# Patient Record
Sex: Female | Born: 1940 | Race: White | Hispanic: No | Marital: Married | Smoking: Never smoker
Health system: Southern US, Community
[De-identification: ages and names within clinical notes are randomized; demographics above are authoritative.]

## PROBLEM LIST (undated history)

## (undated) DIAGNOSIS — I4891 Unspecified atrial fibrillation: Secondary | ICD-10-CM

## (undated) DIAGNOSIS — I639 Cerebral infarction, unspecified: Secondary | ICD-10-CM

## (undated) DIAGNOSIS — I10 Essential (primary) hypertension: Secondary | ICD-10-CM

## (undated) DIAGNOSIS — E7800 Pure hypercholesterolemia, unspecified: Secondary | ICD-10-CM

---

## 1998-08-26 ENCOUNTER — Ambulatory Visit (HOSPITAL_COMMUNITY): Admission: RE | Admit: 1998-08-26 | Discharge: 1998-08-26 | Payer: Self-pay | Admitting: Obstetrics & Gynecology

## 2008-10-04 ENCOUNTER — Emergency Department (HOSPITAL_BASED_OUTPATIENT_CLINIC_OR_DEPARTMENT_OTHER): Admission: EM | Admit: 2008-10-04 | Discharge: 2008-10-05 | Payer: Self-pay | Admitting: Emergency Medicine

## 2008-10-04 ENCOUNTER — Ambulatory Visit: Payer: Self-pay | Admitting: Diagnostic Radiology

## 2011-01-03 LAB — DIFFERENTIAL
Eosinophils Absolute: 0.1 10*3/uL (ref 0.0–0.7)
Eosinophils Relative: 1 % (ref 0–5)
Lymphs Abs: 0.9 10*3/uL (ref 0.7–4.0)
Monocytes Relative: 5 % (ref 3–12)

## 2011-01-03 LAB — URINALYSIS, ROUTINE W REFLEX MICROSCOPIC
Ketones, ur: 15 mg/dL — AB
Nitrite: NEGATIVE
pH: 5.5 (ref 5.0–8.0)

## 2011-01-03 LAB — COMPREHENSIVE METABOLIC PANEL
Albumin: 4.4 g/dL (ref 3.5–5.2)
Alkaline Phosphatase: 77 U/L (ref 39–117)
BUN: 21 mg/dL (ref 6–23)
CO2: 24 mEq/L (ref 19–32)
Chloride: 98 mEq/L (ref 96–112)
Creatinine, Ser: 1.4 mg/dL — ABNORMAL HIGH (ref 0.4–1.2)
GFR calc non Af Amer: 38 mL/min — ABNORMAL LOW (ref >60)
Glucose, Bld: 120 mg/dL — ABNORMAL HIGH (ref 70–99)
Potassium: 2.8 mEq/L — ABNORMAL LOW (ref 3.5–5.1)
Total Bilirubin: 0.6 mg/dL (ref 0.3–1.2)

## 2011-01-03 LAB — URINE CULTURE: Colony Count: >=100000

## 2011-01-03 LAB — URINE MICROSCOPIC-ADD ON

## 2011-01-03 LAB — CBC
HCT: 40.5 % (ref 36.0–46.0)
Hemoglobin: 13.9 g/dL (ref 12.0–15.0)
MCV: 83 fL (ref 78.0–100.0)
Platelets: 296 10*3/uL (ref 150–400)
RBC: 4.88 MIL/uL (ref 3.87–5.11)
WBC: 14 10*3/uL — ABNORMAL HIGH (ref 4.0–10.5)

## 2011-01-03 LAB — PROTIME-INR: Prothrombin Time: 12.7 seconds (ref 11.6–15.2)

## 2015-08-26 DIAGNOSIS — I340 Nonrheumatic mitral (valve) insufficiency: Secondary | ICD-10-CM | POA: Insufficient documentation

## 2015-08-26 DIAGNOSIS — E785 Hyperlipidemia, unspecified: Secondary | ICD-10-CM | POA: Diagnosis present

## 2015-08-26 DIAGNOSIS — I10 Essential (primary) hypertension: Secondary | ICD-10-CM | POA: Diagnosis present

## 2016-04-05 DIAGNOSIS — F411 Generalized anxiety disorder: Secondary | ICD-10-CM | POA: Insufficient documentation

## 2016-04-05 DIAGNOSIS — G629 Polyneuropathy, unspecified: Secondary | ICD-10-CM | POA: Insufficient documentation

## 2016-04-05 DIAGNOSIS — K219 Gastro-esophageal reflux disease without esophagitis: Secondary | ICD-10-CM | POA: Insufficient documentation

## 2016-11-22 ENCOUNTER — Emergency Department (HOSPITAL_COMMUNITY): Payer: Medicare Other

## 2016-11-22 ENCOUNTER — Encounter (HOSPITAL_COMMUNITY): Payer: Self-pay

## 2016-11-22 ENCOUNTER — Inpatient Hospital Stay (HOSPITAL_COMMUNITY)
Admission: EM | Admit: 2016-11-22 | Discharge: 2016-11-25 | DRG: 064 | Disposition: A | Discharge status: Home / Self Care | Payer: Medicare Other | Attending: Internal Medicine | Admitting: Internal Medicine

## 2016-11-22 DIAGNOSIS — Z79899 Other long term (current) drug therapy: Secondary | ICD-10-CM

## 2016-11-22 DIAGNOSIS — I63411 Cerebral infarction due to embolism of right middle cerebral artery: Secondary | ICD-10-CM | POA: Diagnosis present

## 2016-11-22 DIAGNOSIS — Z66 Do not resuscitate: Secondary | ICD-10-CM | POA: Diagnosis present

## 2016-11-22 DIAGNOSIS — R471 Dysarthria and anarthria: Secondary | ICD-10-CM | POA: Diagnosis present

## 2016-11-22 DIAGNOSIS — H518 Other specified disorders of binocular movement: Secondary | ICD-10-CM | POA: Diagnosis present

## 2016-11-22 DIAGNOSIS — R29810 Facial weakness: Secondary | ICD-10-CM | POA: Diagnosis present

## 2016-11-22 DIAGNOSIS — E785 Hyperlipidemia, unspecified: Secondary | ICD-10-CM | POA: Diagnosis present

## 2016-11-22 DIAGNOSIS — Z91040 Latex allergy status: Secondary | ICD-10-CM | POA: Diagnosis not present

## 2016-11-22 DIAGNOSIS — F411 Generalized anxiety disorder: Secondary | ICD-10-CM | POA: Diagnosis not present

## 2016-11-22 DIAGNOSIS — I480 Paroxysmal atrial fibrillation: Secondary | ICD-10-CM | POA: Diagnosis not present

## 2016-11-22 DIAGNOSIS — Z881 Allergy status to other antibiotic agents status: Secondary | ICD-10-CM | POA: Diagnosis not present

## 2016-11-22 DIAGNOSIS — Z515 Encounter for palliative care: Secondary | ICD-10-CM | POA: Diagnosis present

## 2016-11-22 DIAGNOSIS — R200 Anesthesia of skin: Secondary | ICD-10-CM | POA: Diagnosis not present

## 2016-11-22 DIAGNOSIS — Z888 Allergy status to other drugs, medicaments and biological substances status: Secondary | ICD-10-CM

## 2016-11-22 DIAGNOSIS — G839 Paralytic syndrome, unspecified: Secondary | ICD-10-CM

## 2016-11-22 DIAGNOSIS — K219 Gastro-esophageal reflux disease without esophagitis: Secondary | ICD-10-CM | POA: Diagnosis present

## 2016-11-22 DIAGNOSIS — I4891 Unspecified atrial fibrillation: Secondary | ICD-10-CM | POA: Diagnosis present

## 2016-11-22 DIAGNOSIS — G629 Polyneuropathy, unspecified: Secondary | ICD-10-CM | POA: Diagnosis present

## 2016-11-22 DIAGNOSIS — G4700 Insomnia, unspecified: Secondary | ICD-10-CM | POA: Diagnosis present

## 2016-11-22 DIAGNOSIS — Z8711 Personal history of peptic ulcer disease: Secondary | ICD-10-CM

## 2016-11-22 DIAGNOSIS — G8194 Hemiplegia, unspecified affecting left nondominant side: Secondary | ICD-10-CM | POA: Diagnosis present

## 2016-11-22 DIAGNOSIS — I10 Essential (primary) hypertension: Secondary | ICD-10-CM | POA: Diagnosis present

## 2016-11-22 DIAGNOSIS — H53462 Homonymous bilateral field defects, left side: Secondary | ICD-10-CM

## 2016-11-22 DIAGNOSIS — I639 Cerebral infarction, unspecified: Secondary | ICD-10-CM

## 2016-11-22 DIAGNOSIS — M1611 Unilateral primary osteoarthritis, right hip: Secondary | ICD-10-CM | POA: Diagnosis present

## 2016-11-22 DIAGNOSIS — G936 Cerebral edema: Secondary | ICD-10-CM | POA: Diagnosis present

## 2016-11-22 DIAGNOSIS — Z91030 Bee allergy status: Secondary | ICD-10-CM | POA: Diagnosis not present

## 2016-11-22 DIAGNOSIS — I63511 Cerebral infarction due to unspecified occlusion or stenosis of right middle cerebral artery: Secondary | ICD-10-CM

## 2016-11-22 HISTORY — DX: Unspecified atrial fibrillation: I48.91

## 2016-11-22 HISTORY — DX: Pure hypercholesterolemia, unspecified: E78.00

## 2016-11-22 HISTORY — DX: Essential (primary) hypertension: I10

## 2016-11-22 LAB — CBC WITH DIFFERENTIAL/PLATELET
BASOS PCT: 1 %
Basophils Absolute: 0.1 10*3/uL (ref 0.0–0.1)
EOS ABS: 0.3 10*3/uL (ref 0.0–0.7)
Eosinophils Relative: 3 %
HCT: 39.6 % (ref 36.0–46.0)
HEMOGLOBIN: 13.7 g/dL (ref 12.0–15.0)
Lymphocytes Relative: 15 %
Lymphs Abs: 1.3 10*3/uL (ref 0.7–4.0)
MCH: 28.9 pg (ref 26.0–34.0)
MCHC: 34.6 g/dL (ref 30.0–36.0)
MCV: 83.5 fL (ref 78.0–100.0)
MONO ABS: 1 10*3/uL (ref 0.1–1.0)
MONOS PCT: 11 %
NEUTROS PCT: 70 %
Neutro Abs: 6.2 10*3/uL (ref 1.7–7.7)
PLATELETS: 166 10*3/uL (ref 150–400)
RBC: 4.74 MIL/uL (ref 3.87–5.11)
RDW: 13.3 % (ref 11.5–15.5)
WBC: 8.9 10*3/uL (ref 4.0–10.5)

## 2016-11-22 LAB — URINALYSIS, ROUTINE W REFLEX MICROSCOPIC
BILIRUBIN URINE: NEGATIVE
Glucose, UA: NEGATIVE mg/dL
HGB URINE DIPSTICK: NEGATIVE
Ketones, ur: NEGATIVE mg/dL
NITRITE: NEGATIVE
PH: 7 (ref 5.0–8.0)
Protein, ur: NEGATIVE mg/dL
SPECIFIC GRAVITY, URINE: 1.034 — AB (ref 1.005–1.030)

## 2016-11-22 LAB — RAPID URINE DRUG SCREEN, HOSP PERFORMED
Amphetamines: NOT DETECTED
BARBITURATES: NOT DETECTED
Benzodiazepines: NOT DETECTED
Cocaine: NOT DETECTED
Opiates: NOT DETECTED
TETRAHYDROCANNABINOL: NOT DETECTED

## 2016-11-22 LAB — PROTIME-INR
INR: 1.11
Prothrombin Time: 14.3 seconds (ref 11.4–15.2)

## 2016-11-22 LAB — COMPREHENSIVE METABOLIC PANEL
ALK PHOS: 41 U/L (ref 38–126)
ALT: 19 U/L (ref 14–54)
AST: 20 U/L (ref 15–41)
Albumin: 3.6 g/dL (ref 3.5–5.0)
Anion gap: 7 (ref 5–15)
BUN: 17 mg/dL (ref 6–20)
CALCIUM: 8.9 mg/dL (ref 8.9–10.3)
CO2: 27 mmol/L (ref 22–32)
CREATININE: 0.85 mg/dL (ref 0.44–1.00)
Chloride: 103 mmol/L (ref 101–111)
GFR calc non Af Amer: >60 mL/min (ref >60)
Glucose, Bld: 97 mg/dL (ref 65–99)
Potassium: 3.4 mmol/L — ABNORMAL LOW (ref 3.5–5.1)
SODIUM: 137 mmol/L (ref 135–145)
Total Bilirubin: 0.9 mg/dL (ref 0.3–1.2)
Total Protein: 6 g/dL — ABNORMAL LOW (ref 6.5–8.1)

## 2016-11-22 LAB — ETHANOL

## 2016-11-22 LAB — APTT: aPTT: 31 seconds (ref 24–36)

## 2016-11-22 LAB — TROPONIN I

## 2016-11-22 LAB — CBG MONITORING, ED: Glucose-Capillary: 90 mg/dL (ref 65–99)

## 2016-11-22 MED ORDER — IOPAMIDOL (ISOVUE-370) INJECTION 76%
INTRAVENOUS | Status: AC
  Administered 2016-11-22: 90 mL
  Filled 2016-11-22: qty 100

## 2016-11-22 MED ORDER — PANTOPRAZOLE SODIUM 40 MG PO TBEC
40.0000 mg | DELAYED_RELEASE_TABLET | Freq: Every day | ORAL | Status: DC
  Filled 2016-11-22: qty 1

## 2016-11-22 MED ORDER — OXYCODONE HCL 5 MG PO TABS: 5.0000 mg | ORAL_TABLET | ORAL | Status: DC | PRN

## 2016-11-22 MED ORDER — LORAZEPAM 0.5 MG PO TABS: 0.5000 mg | ORAL_TABLET | Freq: Three times a day (TID) | ORAL | Status: DC | PRN

## 2016-11-22 MED ORDER — METOPROLOL SUCCINATE ER 25 MG PO TB24: 50.0000 mg | ORAL_TABLET | Freq: Every day | ORAL | Status: DC

## 2016-11-22 MED ORDER — ZOLPIDEM TARTRATE 5 MG PO TABS
5.0000 mg | ORAL_TABLET | Freq: Every evening | ORAL | Status: DC | PRN
  Administered 2016-11-24: 5 mg via ORAL
  Filled 2016-11-22: qty 1

## 2016-11-22 MED ORDER — SODIUM CHLORIDE 0.9 % IV SOLN
100.0000 mL/h | INTRAVENOUS | Status: DC
  Administered 2016-11-22: 100 mL/h via INTRAVENOUS

## 2016-11-22 MED ORDER — ATORVASTATIN CALCIUM 80 MG PO TABS
80.0000 mg | ORAL_TABLET | Freq: Every day | ORAL | Status: DC
  Administered 2016-11-22: 80 mg via ORAL
  Filled 2016-11-22: qty 1

## 2016-11-22 MED ORDER — SENNOSIDES-DOCUSATE SODIUM 8.6-50 MG PO TABS: 1.0000 | ORAL_TABLET | Freq: Every evening | ORAL | Status: DC | PRN

## 2016-11-22 MED ORDER — GABAPENTIN 300 MG PO CAPS
300.0000 mg | ORAL_CAPSULE | Freq: Three times a day (TID) | ORAL | Status: DC
  Administered 2016-11-22: 300 mg via ORAL
  Filled 2016-11-22 (×2): qty 1

## 2016-11-22 MED ORDER — TRAMADOL HCL 50 MG PO TABS
50.0000 mg | ORAL_TABLET | Freq: Four times a day (QID) | ORAL | Status: DC | PRN
Start: 2016-11-22 — End: 2016-11-23

## 2016-11-22 MED ORDER — ONDANSETRON HCL 4 MG PO TABS: 4.0000 mg | ORAL_TABLET | Freq: Four times a day (QID) | ORAL | Status: DC | PRN

## 2016-11-22 MED ORDER — ASPIRIN 325 MG PO TABS
325.0000 mg | ORAL_TABLET | Freq: Every day | ORAL | Status: DC
  Administered 2016-11-22: 325 mg via ORAL
  Filled 2016-11-22 (×2): qty 1

## 2016-11-22 MED ORDER — ACETAMINOPHEN 325 MG PO TABS: 650.0000 mg | ORAL_TABLET | Freq: Four times a day (QID) | ORAL | Status: DC | PRN

## 2016-11-22 MED ORDER — DEXTROSE-NACL 5-0.45 % IV SOLN
INTRAVENOUS | Status: DC
  Administered 2016-11-22: 19:00:00 via INTRAVENOUS

## 2016-11-22 MED ORDER — MONTELUKAST SODIUM 10 MG PO TABS
10.0000 mg | ORAL_TABLET | Freq: Every day | ORAL | Status: DC
  Administered 2016-11-22: 10 mg via ORAL
  Filled 2016-11-22: qty 1

## 2016-11-22 MED ORDER — ONDANSETRON HCL 4 MG/2ML IJ SOLN: 4.0000 mg | Freq: Four times a day (QID) | INTRAMUSCULAR | Status: DC | PRN

## 2016-11-22 MED ORDER — SODIUM CHLORIDE 0.9 % IV BOLUS (SEPSIS)
500.0000 mL | Freq: Once | INTRAVENOUS | Status: AC
  Administered 2016-11-22: 500 mL via INTRAVENOUS

## 2016-11-22 MED ORDER — PHENOL 1.4 % MT LIQD: 1.0000 | OROMUCOSAL | Status: DC | PRN

## 2016-11-22 MED ORDER — IOPAMIDOL (ISOVUE-370) INJECTION 76%
INTRAVENOUS | Status: AC
  Filled 2016-11-22: qty 50

## 2016-11-22 NOTE — ED Triage Notes (Signed)
Pt arrives with L sided facial droop and complete L sided hemiparesis

## 2016-11-22 NOTE — ED Notes (Signed)
Right and left radial pulses bilat equal. Right dorsal pedia pulse strong. Left dorsalis pedia pulse faint. Right and left tibial pulses. Moderate and equal.

## 2016-11-22 NOTE — Consult Note (Signed)
Requesting Physician:  ED MD    Chief Complaint:   History obtained from:  Patient   Chart   HPI:                                                                                                                                         Katherine Mueller is an 76 y.o. female who unfortunately was normal until approximately 8 PM last night. This morning she was found by her husband to be flaccid on her left upper and lower extremity with the inability to move she also had a left facial droop. EMS was called, one of which was her son, [facility] but not a code stroke that she was out of the window. Patient was immediately brought to the CT scan. Patient had a CTA and CT perfusion. Unfortunately she did have a notable acute occlusion with clot from the proximal right common carotid artery to the right M2 branch. Ischemia and the entire right MCA territory with 80 mL infarct core. Unfortunately the core was too large to try to revive with intra-arterial surgery. There is also noted a 6 mm left supraclinoid ICA aneurysm which will need to be followed possibly. This was discussed with family and family was in agreement not to go further.  Date last known well: Date: 11/21/2016 Time last known well: Time: 20:00 tPA Given: No: out of window   Past Medical History:  Diagnosis Date  . A-fib (HCC)   . High cholesterol   . Hypertension     History reviewed. No pertinent surgical history.  No family history on file. Social History:  does not have a smoking history on file. She has never used smokeless tobacco. She reports that she does not drink alcohol. Her drug history is not on file.  Allergies:  Allergies  Allergen Reactions  . Bee Venom Swelling  . Meperidine Swelling    Swelling of lips  . Azithromycin Other (See Comments)    unspecified  . Celecoxib Other (See Comments)    unspecified  . Latex Other (See Comments)    unspecified  . Betamethasone Swelling     Facial swelling after steroid injection  . Prednisone Rash    Medications:  Current Facility-Administered Medications  Medication Dose Route Frequency Provider Last Rate Last Dose  . 0.9 %  sodium chloride infusion  100 mL/hr Intravenous Continuous Gerhard Munch, MD 100 mL/hr at 11/22/16 1234 100 mL/hr at 11/22/16 1234   Current Outpatient Prescriptions  Medication Sig Dispense Refill  . acetaminophen (TYLENOL) 325 MG tablet Take 650 mg by mouth every 6 (six) hours as needed for mild pain.    . B Complex-C (B-COMPLEX WITH VITAMIN C) tablet Take 1 tablet by mouth daily.    . diphenhydrAMINE (BENADRYL) 25 MG tablet Take 25 mg by mouth every 6 (six) hours as needed for sleep.    Marland Kitchen gabapentin (NEURONTIN) 300 MG capsule Take 300 mg by mouth 3 (three) times daily.    . hydrochlorothiazide (HYDRODIURIL) 25 MG tablet Take 25 mg by mouth daily.    Marland Kitchen LORazepam (ATIVAN) 0.5 MG tablet Take 0.5 mg by mouth every 8 (eight) hours as needed for anxiety.    . metoprolol succinate (TOPROL-XL) 50 MG 24 hr tablet Take 50 mg by mouth daily. Take with or immediately following a meal.    . montelukast (SINGULAIR) 10 MG tablet Take 10 mg by mouth at bedtime.    . Multiple Vitamin (MULTIVITAMIN) tablet Take 1 tablet by mouth daily.    Marland Kitchen omeprazole (PRILOSEC) 20 MG capsule Take 20 mg by mouth 2 (two) times daily before a meal.    . pravastatin (PRAVACHOL) 20 MG tablet Take 20 mg by mouth daily.    Marland Kitchen zolpidem (AMBIEN) 5 MG tablet Take 5 mg by mouth at bedtime as needed for sleep.       ROS:                                                                                                                                       History obtained from unobtainable from patient due to mental status   Neurologic Examination:                                                                                                       Blood pressure (!) 166/105, pulse 69, resp. rate 20, SpO2 92 %.  HEENT-  Normocephalic, no lesions, without obvious abnormality.  Normal external eye and conjunctiva.  Normal TM's bilaterally.  Normal auditory canals and external ears. Normal external nose, mucus membranes and septum.  Normal pharynx. Cardiovascular- irregularly irregular rhythm, pulses palpable throughout   Lungs- chest clear, no wheezing, rales, normal symmetric air entry Abdomen-  normal findings: bowel sounds normal Extremities- no edema Lymph-no adenopathy palpable Musculoskeletal-no joint tenderness, deformity or swelling Skin-warm and dry, no hyperpigmentation, vitiligo, or suspicious lesions  Neurological Examination Mental Status: Alert, oriented, thought content appropriate.  Speechdysarthric without evidence of aphasia.  Able to follow simple commands without difficulty. Cranial Nerves: II:  Visual fields grossly normal,  III,IV, VI: ptosis not present, right gaze preference and able to slightly cross midline to the left, pupils equal, round, reactive to light and accommodation V,VII: smile asymmetric on the left, facial light touch sensation decreased on the left IX,X: uvula rises symmetrically XI: bilateral shoulder shrug XII: ED 8 slightly to the left Motor: Flaccid left upper and lower extremity, 5/5 strength on the right Sensory: No sensation noted on the left upper and lower extremity  Deep Tendon Reflexes: 2+ and symmetric throughout Plantars: Right: downgoing   Left: Upgoing Cerebellar: normal finger-to-nose on the right,  and normal heel-to-shin test on the right Gait: Not tested       Lab Results: Basic Metabolic Panel:  Recent Labs Lab 11/22/16 1113  NA 137  K 3.4*  CL 103  CO2 27  GLUCOSE 97  BUN 17  CREATININE 0.85  CALCIUM 8.9    Liver Function Tests:  Recent Labs Lab 11/22/16 1113  AST 20  ALT 19  ALKPHOS 41  BILITOT 0.9  PROT 6.0*  ALBUMIN  3.6   No results for input(s): LIPASE, AMYLASE in the last 168 hours. No results for input(s): AMMONIA in the last 168 hours.  CBC:  Recent Labs Lab 11/22/16 1113  WBC 8.9  NEUTROABS 6.2  HGB 13.7  HCT 39.6  MCV 83.5  PLT 166    Cardiac Enzymes:  Recent Labs Lab 11/22/16 1113  TROPONINI <0.03    Lipid Panel: No results for input(s): CHOL, TRIG, HDL, CHOLHDL, VLDL, LDLCALC in the last 168 hours.  CBG:  Recent Labs Lab 11/22/16 1038  GLUCAP 90    Microbiology: Results for orders placed or performed during the hospital encounter of 10/04/08  Urine culture     Status: None   Collection Time: 10/04/08  5:09 PM  Result Value Ref Range Status   Specimen Description URINE, CLEAN CATCH  Final   Special Requests NONE  Final   Colony Count >=100,000 COLONIES/ML  Final   Culture ESCHERICHIA COLI  Final   Report Status 10/06/2008 FINAL  Final   Organism ID, Bacteria ESCHERICHIA COLI  Final      Susceptibility   Escherichia coli - MIC    AMPICILLIN <=2 Sensitive     CEFAZOLIN <=4 Sensitive     CEFTRIAXONE <=1 Sensitive     CIPROFLOXACIN <=0.25 Sensitive     GENTAMICIN <=1 Sensitive     LEVOFLOXACIN <=0.12 Sensitive     NITROFURANTOIN <=16 Sensitive     TOBRAMYCIN <=1 Sensitive     TRIMETH/SULFA <=20 Sensitive     Coagulation Studies:  Recent Labs  11/22/16 1113  LABPROT 14.3  INR 1.11    Imaging: Ct Angio Head W Or Wo Contrast  Result Date: 11/22/2016 CLINICAL DATA:  Left hemiplegia EXAM: CT ANGIOGRAPHY HEAD AND NECK CT PERFUSION BRAIN TECHNIQUE: Multidetector CT imaging of the head and neck was performed using the standard protocol during bolus administration of intravenous contrast. Multiplanar CT image reconstructions and MIPs were obtained to evaluate the vascular anatomy. Carotid stenosis measurements (when applicable) are obtained utilizing NASCET criteria, using the distal internal carotid diameter as the denominator. Multiphase CT imaging of the  brain  was performed following IV bolus contrast injection. Subsequent parametric perfusion maps were calculated using RAPID software. CONTRAST:  90 cc Isovue 370 intravenous COMPARISON:  Head CT from earlier today FINDINGS: CTA NECK FINDINGS Aortic arch: Atherosclerosis. Three vessel branching. 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 The clot is contiguous to the level of the right M2 segment. Left carotid system: Mild for age scattered calcified atherosclerotic plaque. No flow limiting stenosis or ulceration. 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 No flow limiting stenosis. No evidence of dissection. Skeleton: There is a lucency in the right occipital bone contiguous with jugular foramen which has smooth sclerotic margins, compatible with a benign process. This is likely an enlarged emissary vein based on density. No second bone lesion. Other neck: Negative Upper chest: Negative Review of the MIP images confirms the above findings CTA HEAD FINDINGS Anterior circulation: Right common carotid clot continues continuously into the right M1 and inferior division M2 branches. Some superior division branches are opacified. An anterior communicating artery is present, clot narrows the right A1 origin. 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 There is a 4 mm neck. Posterior circulation: Symmetric vertebral arteries. Left posterior communicating artery. No major branch occlusion or flow limiting stenosis noted. Venous sinuses: As permitted by contrast timing, patent. Anatomic variants: None significant Delayed phase: Not performed in the emergent setting Review of the MIP images confirms the above findings CT Brain Perfusion Findings: CBF (<30%) Volume:  80 mL Perfusion (Tmax>6.0s) volume: Mismatch Volume: Infarction Location:Densest infarct  involves the right insula, operculum, internal capsule/putamen and antro lateral frontal cortex. These results were called by telephone at the time of interpretation on 11/22/2016 at 11:07 am to Dr. Roxy Manns , who verbally acknowledged these results. IMPRESSION: 1. 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 Electronically Signed   By: Marnee Spring M.D.   On: 11/22/2016 11:27   Ct Head Wo Contrast  Result Date: 11/22/2016 CLINICAL DATA:  Left facial droop and left sided hemiparesis. EXAM: CT HEAD WITHOUT CONTRAST TECHNIQUE: Contiguous axial images were obtained from the base of the skull through the vertex without intravenous contrast. COMPARISON:  None. FINDINGS: Brain: Evidence of cytotoxic edema in the right MCA territory consistent with a large infarction. There is a dense right MCA sign. In no hemorrhage. No extra-axial fluid collections. Ventricles are in the midline without significant mass effect or shift. The right lateral ventricle may be slightly flattened. Underlying atrophy and periventricular white matter disease. Vascular: Dense right MCA sign.  Vascular calcifications noted. Skull: No skull fracture or bone lesions. Sinuses/Orbits: The paranasal sinuses and mastoid air cells are clear. The globes are intact. Other: No scalp lesion or hematoma. 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 No intracranial hemorrhage. Underlying cerebral atrophy and periventricular white matter disease. These results were called by telephone at the time of interpretation on 11/22/2016 at 11:03 am to Dr. Gerhard Munch , who verbally acknowledged these results. Electronically Signed   By: Rudie Meyer M.D.   On: 11/22/2016 11:03   Ct Angio Neck W Or Wo Contrast  Result Date: 11/22/2016 CLINICAL DATA:   Left hemiplegia EXAM: CT ANGIOGRAPHY HEAD AND  NECK CT PERFUSION BRAIN TECHNIQUE: Multidetector CT imaging of the head and neck was performed using the standard protocol during bolus administration of intravenous contrast. Multiplanar CT image reconstructions and MIPs were obtained to evaluate the vascular anatomy. Carotid stenosis measurements (when applicable) are obtained utilizing NASCET criteria, using the distal internal carotid diameter as the denominator. Multiphase CT imaging of the brain was performed following IV bolus contrast injection. Subsequent parametric perfusion maps were calculated using RAPID software. CONTRAST:  90 cc Isovue 370 intravenous COMPARISON:  Head CT from earlier today FINDINGS: CTA NECK FINDINGS Aortic arch: Atherosclerosis. Three vessel branching. 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 The clot is contiguous to the level of the right M2 segment. Left carotid system: Mild for age scattered calcified atherosclerotic plaque. No flow limiting stenosis or ulceration. 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 No flow limiting stenosis. No evidence of dissection. Skeleton: There is a lucency in the right occipital bone contiguous with jugular foramen which has smooth sclerotic margins, compatible with a benign process. This is likely an enlarged emissary vein based on density. No second bone lesion. Other neck: Negative Upper chest: Negative Review of the MIP images confirms the above findings CTA HEAD FINDINGS Anterior circulation: Right common carotid clot continues continuously into the right M1 and inferior division M2 branches. Some superior division branches are opacified. An anterior communicating artery is present, clot narrows the right A1 origin. 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 There is a 4 mm neck.  Posterior circulation: Symmetric vertebral arteries. Left posterior communicating artery. No major branch occlusion or flow limiting stenosis noted. Venous sinuses: As permitted by contrast timing, patent. Anatomic variants: None significant Delayed phase: Not performed in the emergent setting Review of the MIP images confirms the above findings CT Brain Perfusion Findings: CBF (<30%) Volume:  80 mL Perfusion (Tmax>6.0s) volume: Mismatch Volume: Infarction Location:Densest infarct involves the right insula, operculum, internal capsule/putamen and antro lateral frontal cortex. These results were called by telephone at the time of interpretation on 11/22/2016 at 11:07 am to Dr. Roxy Manns , who verbally acknowledged these results. IMPRESSION: 1. 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 Electronically Signed   By: Marnee Spring M.D.   On: 11/22/2016 11:27   Ct Cerebral Perfusion W Contrast  Result Date: 11/22/2016 CLINICAL DATA:  Left hemiplegia EXAM: CT ANGIOGRAPHY HEAD AND NECK CT PERFUSION BRAIN TECHNIQUE: Multidetector CT imaging of the head and neck was performed using the standard protocol during bolus administration of intravenous contrast. Multiplanar CT image reconstructions and MIPs were obtained to evaluate the vascular anatomy. Carotid stenosis measurements (when applicable) are obtained utilizing NASCET criteria, using the distal internal carotid diameter as the denominator. Multiphase CT imaging of the brain was performed following IV bolus contrast injection. Subsequent parametric perfusion maps were calculated using RAPID software. CONTRAST:  90 cc Isovue 370 intravenous COMPARISON:  Head CT from earlier today FINDINGS: CTA NECK FINDINGS Aortic arch: Atherosclerosis. Three vessel branching. No acute finding Right  carotid system: Common carotid occlusion just beyond the brachiocephalic branching. The clot is contiguous to the level of the right M2 segment. Left carotid system: Mild for age scattered calcified atherosclerotic plaque. No flow limiting  stenosis or ulceration. 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 No flow limiting stenosis. No evidence of dissection. Skeleton: There is a lucency in the right occipital bone contiguous with jugular foramen which has smooth sclerotic margins, compatible with a benign process. This is likely an enlarged emissary vein based on density. No second bone lesion. Other neck: Negative Upper chest: Negative Review of the MIP images confirms the above findings CTA HEAD FINDINGS Anterior circulation: Right common carotid clot continues continuously into the right M1 and inferior division M2 branches. Some superior division branches are opacified. An anterior communicating artery is present, clot narrows the right A1 origin. 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 There is a 4 mm neck. Posterior circulation: Symmetric vertebral arteries. Left posterior communicating artery. No major branch occlusion or flow limiting stenosis noted. Venous sinuses: As permitted by contrast timing, patent. Anatomic variants: None significant Delayed phase: Not performed in the emergent setting Review of the MIP images confirms the above findings CT Brain Perfusion Findings: CBF (<30%) Volume:  80 mL Perfusion (Tmax>6.0s) volume: Mismatch Volume: Infarction Location:Densest infarct involves the right insula, operculum, internal capsule/putamen and antro lateral frontal cortex. These results were called by telephone at the time of interpretation on 11/22/2016 at 11:07 am to Dr. Roxy Manns , who verbally acknowledged these results. IMPRESSION: 1. Acute occlusion with clot from the proximal right common  carotid to the right M2 branches. 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 Electronically Signed   By: Marnee Spring M.D.   On: 11/22/2016 11:27       Assessment and plan discussed with with attending physician and they are in agreement.    Felicie Morn PA-C [facility] [phone]  11/22/2016, 1:21 PM   Assessment: 76 y.o. female unfortunately who suffered a large right MCA infarct which was not amendable to TPA or intra-arterial surgery.  Stroke Risk Factors - atrial fibrillation, hyperlipidemia and hypertension  1. HgbA1c, fasting lipid panel 2. MRI, 3. PT consult, OT consult, Speech consult 4. Echocardiogram 6. Prophylactic therapy-Antiplatelet med: Aspirin - dose 325 mg daily 7. Risk factor modification 8. Telemetry monitoring 9. Frequent neuro checks 10 NPO until passes stroke swallow screen 11 please page stroke NP  Or  PA  Or MD from 8am -4 pm  as this patient from this time will be  followed by the stroke.   You can look them up on [email or web address]  Password [email or web address]

## 2016-11-22 NOTE — ED Notes (Signed)
Neuro at bedside pt has L side motor involvement and visual involvement pt also has sensory deficit from the L to the R

## 2016-11-22 NOTE — Progress Notes (Signed)
Patient arrived to the unit.  A&O X4. Left side neglect, LUE&LLE-flaccid and PT did not acknowledge sensation. Reports pain in hip and elbow.  Speech therapist at bedside. MD notified of arrival--MD confirms No Q2/NIH  Will continue to monitor.

## 2016-11-22 NOTE — Evaluation (Signed)
Clinical/Bedside Swallow Evaluation Patient Details  Name: Katherine Mueller MRN: [id number] Date of Birth: 05/15/1941  Today's Date: 11/22/2016 Time: SLP Start Time (ACUTE ONLY): 1600 SLP Stop Time (ACUTE ONLY): 1617 SLP Time Calculation (min) (ACUTE ONLY): 17 min  Past Medical History:  Past Medical History:  Diagnosis Date  . A-fib (HCC)   . High cholesterol   . Hypertension    Past Surgical History: History reviewed. No pertinent surgical history. HPI:  76 y.o.femaleadmitted with left-sided weakness, found to have large right MCA infarct which was not amendable to TPA or intra-arterial surgery. PMH includes HTN, a-fib.   Assessment / Plan / Recommendation Clinical Impression  Pt is drowsy, requiring Mod cues to maintain alertness and eye opening for PO trials. When alert, she does follow commands well and she is accepting of POs offered. Immediate coughing was noted following initial spoonful of thin liquid. Oral holding was observed with purees. Recommend to remain NPO except for meds crushed in puree and ice chips after oral care - only when alert. Will f/u for PO readiness as her level of alertness improves. SLP Visit Diagnosis: Dysphagia, unspecified (R13.10)    Aspiration Risk  Moderate aspiration risk    Diet Recommendation NPO except meds;Ice chips PRN after oral care   Medication Administration: Crushed with puree    Other  Recommendations Oral Care Recommendations: Oral care QID;Oral care prior to ice chip/H20 Other Recommendations: Have oral suction available   Follow up Recommendations  (tba)      Frequency and Duration min 2x/week  2 weeks       Prognosis Prognosis for Safe Diet Advancement: Good      Swallow Study   General HPI: 76 y.o.femaleadmitted with left-sided weakness, found to have large right MCA infarct which was not amendable to TPA or intra-arterial surgery. PMH includes HTN, a-fib. Type of Study: Bedside Swallow Evaluation Previous Swallow  Assessment: none in chart Diet Prior to this Study: NPO Temperature Spikes Noted: No Respiratory Status: Room air History of Recent Intubation: No Behavior/Cognition: Lethargic/Drowsy;Cooperative Oral Cavity Assessment: Within Functional Limits Oral Care Completed by SLP: No Oral Cavity - Dentition: Adequate natural dentition Vision:  (mostly keeps eyes closed) Self-Feeding Abilities: Total assist Patient Positioning: Upright in bed Baseline Vocal Quality: Normal Volitional Cough: Weak Volitional Swallow: Able to elicit    Oral/Motor/Sensory Function Overall Oral Motor/Sensory Function: Moderate impairment Facial ROM: Reduced left;Suspected CN VII (facial) dysfunction Facial Symmetry: Abnormal symmetry left;Suspected CN VII (facial) dysfunction Facial Strength: Reduced left;Suspected CN VII (facial) dysfunction Lingual ROM: Within Functional Limits Lingual Symmetry: Abnormal symmetry left;Suspected CN XII (hypoglossal) dysfunction Lingual Strength: Reduced;Suspected CN XII (hypoglossal) dysfunction   Ice Chips Ice chips: Within functional limits Presentation: Spoon   Thin Liquid Thin Liquid: Impaired Presentation: Spoon Pharyngeal  Phase Impairments: Cough - Immediate    Nectar Thick Nectar Thick Liquid: Not tested   Honey Thick Honey Thick Liquid: Not tested   Puree Puree: Impaired Presentation: Spoon Oral Phase Functional Implications: Oral holding Pharyngeal Phase Impairments: Suspected delayed Swallow   Solid   GO   Solid: Not tested        Katherine Mueller, Katherine Mueller 11/22/2016,4:37 PM   Katherine Mueller, M.A. CCC-SLP [phone]

## 2016-11-22 NOTE — ED Notes (Signed)
Pt brought back from CT family brought to bedside to speak with family regarding CT results

## 2016-11-22 NOTE — ED Provider Notes (Signed)
[facility]-EMERGENCY DEPT Provider Note   CSN: [id number] Arrival date & time: 11/22/16  1034     History   Chief Complaint Chief Complaint  Patient presents with  . Altered Mental Status    pt last known well time 2030 last night was able to walk to bed last night woke up this morning at 0830 with complete L sided hemiparesis     HPI Katherine Mueller is a 76 y.o. female.  HPI  Patient presents with family members due to new inability to ambulate, speech difficulty. Per report, and the patient, she was last seen normal 14 hours ago. Patient has history of atrial fibrillation, no history of prior stroke. Patient gives largely unintelligible responses, but seems to deny pain, recent medication changes per The patient's son states that she was in her usual state of health prior to awakening this morning. She is typically ambulatory, speaks clearly, and is currently notably different. Level V caveat secondary to acuity of condition.   Past Medical History:  Diagnosis Date  . A-fib (HCC)   . High cholesterol   . Hypertension     There are no active problems to display for this patient.   History reviewed. No pertinent surgical history.  OB History    No data available       Home Medications    Prior to Admission medications   Not on File    Family History No family history on file.  Social History Social History  Substance Use Topics  . Smoking status: Not on file  . Smokeless tobacco: Never Used  . Alcohol use No     Allergies   Patient has no allergy information on record.   Review of Systems Review of Systems  Unable to perform ROS: Acuity of condition     Physical Exam Updated Vital Signs There were no vitals taken for this visit.  Physical Exam  Constitutional: She is oriented to person, place, and time.  Elderly female awake and alert, garbled speech, facial asymmetry, with right-sided loss of nasolabial fold  HENT:  Head: Atraumatic.    Eyes: Conjunctivae and EOM are normal.  Cardiovascular: Normal rate and regular rhythm.   Pulmonary/Chest: Effort normal and breath sounds normal. No stridor. No respiratory distress.  Abdominal: She exhibits no distension.  Musculoskeletal: She exhibits no edema.  Neurological: She is alert and oriented to person, place, and time. She displays no tremor. A cranial nerve deficit is present. She exhibits abnormal muscle tone. She displays no seizure activity. Coordination abnormal.  Left-sided hemi-plegia upper and lower extremity. Right facial loss of nasolabial fold, dysarthria  Skin: Skin is warm and dry.  Psychiatric: She has a normal mood and affect.  Nursing note and vitals reviewed.    ED Treatments / Results  Labs (all labs ordered are listed, but only abnormal results are displayed) Labs Reviewed  COMPREHENSIVE METABOLIC PANEL - Abnormal; Notable for the following:       Result Value   Potassium 3.4 (*)    Total Protein 6.0 (*)    All other components within normal limits  APTT  CBC WITH DIFFERENTIAL/PLATELET  ETHANOL  TROPONIN I  PROTIME-INR  RAPID URINE DRUG SCREEN, HOSP PERFORMED  URINALYSIS, ROUTINE W REFLEX MICROSCOPIC  CBG MONITORING, ED    EKG  EKG Interpretation  Date/Time:  Tuesday November 22 2016 10:37:08 EST Ventricular Rate:  66 PR Interval:    QRS Duration: 83 QT Interval:  450 QTC Calculation: 472 R Axis:  63 Text Interpretation:  Atrial fibrillation Borderline T wave abnormalities Baseline wander in lead(s) II III aVF Abnormal ekg Confirmed by Gerhard Munch  MD [phone]) on 11/22/2016 10:40:15 AM       Radiology Ct Angio Head W Or Wo Contrast  Result Date: 11/22/2016 CLINICAL DATA:  Left hemiplegia EXAM: CT ANGIOGRAPHY HEAD AND NECK CT PERFUSION BRAIN TECHNIQUE: Multidetector CT imaging of the head and neck was performed using the standard protocol during bolus administration of intravenous contrast. Multiplanar CT image reconstructions and MIPs  were obtained to evaluate the vascular anatomy. Carotid stenosis measurements (when applicable) are obtained utilizing NASCET criteria, using the distal internal carotid diameter as the denominator. Multiphase CT imaging of the brain was performed following IV bolus contrast injection. Subsequent parametric perfusion maps were calculated using RAPID software. CONTRAST:  90 cc Isovue 370 intravenous COMPARISON:  Head CT from earlier today FINDINGS: CTA NECK FINDINGS Aortic arch: Atherosclerosis. Three vessel branching. 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 The clot is contiguous to the level of the right M2 segment. Left carotid system: Mild for age scattered calcified atherosclerotic plaque. No flow limiting stenosis or ulceration. 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 No flow limiting stenosis. No evidence of dissection. Skeleton: There is a lucency in the right occipital bone contiguous with jugular foramen which has smooth sclerotic margins, compatible with a benign process. This is likely an enlarged emissary vein based on density. No second bone lesion. Other neck: Negative Upper chest: Negative Review of the MIP images confirms the above findings CTA HEAD FINDINGS Anterior circulation: Right common carotid clot continues continuously into the right M1 and inferior division M2 branches. Some superior division branches are opacified. An anterior communicating artery is present, clot narrows the right A1 origin. 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 There is a 4 mm neck. Posterior circulation: Symmetric vertebral arteries. Left posterior communicating artery. No major branch occlusion or flow limiting stenosis noted. Venous sinuses: As permitted by contrast timing, patent. Anatomic variants: None significant Delayed phase: Not performed  in the emergent setting Review of the MIP images confirms the above findings CT Brain Perfusion Findings: CBF (<30%) Volume:  80 mL Perfusion (Tmax>6.0s) volume: Mismatch Volume: Infarction Location:Densest infarct involves the right insula, operculum, internal capsule/putamen and antro lateral frontal cortex. These results were called by telephone at the time of interpretation on 11/22/2016 at 11:07 am to Dr. Roxy Manns , who verbally acknowledged these results. IMPRESSION: 1. 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 Electronically Signed   By: Marnee Spring M.D.   On: 11/22/2016 11:27   Ct Head Wo Contrast  Result Date: 11/22/2016 CLINICAL DATA:  Left facial droop and left sided hemiparesis. EXAM: CT HEAD WITHOUT CONTRAST TECHNIQUE: Contiguous axial images were obtained from the base of the skull through the vertex without intravenous contrast. COMPARISON:  None. FINDINGS: Brain: Evidence of cytotoxic edema in the right MCA territory consistent with a large infarction. There is a dense right MCA sign. In no hemorrhage. No extra-axial fluid collections. Ventricles are in the midline without significant mass effect or shift. The right lateral ventricle may be slightly flattened. Underlying atrophy and periventricular white matter disease. Vascular: Dense right  MCA sign.  Vascular calcifications noted. Skull: No skull fracture or bone lesions. Sinuses/Orbits: The paranasal sinuses and mastoid air cells are clear. The globes are intact. Other: No scalp lesion or hematoma. 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 No intracranial hemorrhage. Underlying cerebral atrophy and periventricular white matter disease. These results were called by telephone at the time of interpretation on 11/22/2016  at 11:03 am to Dr. Gerhard Munch , who verbally acknowledged these results. Electronically Signed   By: Rudie Meyer M.D.   On: 11/22/2016 11:03   Ct Angio Neck W Or Wo Contrast  Result Date: 11/22/2016 CLINICAL DATA:  Left hemiplegia EXAM: CT ANGIOGRAPHY HEAD AND NECK CT PERFUSION BRAIN TECHNIQUE: Multidetector CT imaging of the head and neck was performed using the standard protocol during bolus administration of intravenous contrast. Multiplanar CT image reconstructions and MIPs were obtained to evaluate the vascular anatomy. Carotid stenosis measurements (when applicable) are obtained utilizing NASCET criteria, using the distal internal carotid diameter as the denominator. Multiphase CT imaging of the brain was performed following IV bolus contrast injection. Subsequent parametric perfusion maps were calculated using RAPID software. CONTRAST:  90 cc Isovue 370 intravenous COMPARISON:  Head CT from earlier today FINDINGS: CTA NECK FINDINGS Aortic arch: Atherosclerosis. Three vessel branching. 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 The clot is contiguous to the level of the right M2 segment. Left carotid system: Mild for age scattered calcified atherosclerotic plaque. No flow limiting stenosis or ulceration. 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 No flow limiting stenosis. No evidence of dissection. Skeleton: There is a lucency in the right occipital bone contiguous with jugular foramen which has smooth sclerotic margins, compatible with a benign process. This is likely an enlarged emissary vein based on density. No second bone lesion. Other neck: Negative Upper chest: Negative Review of the MIP images confirms the above findings CTA HEAD FINDINGS Anterior circulation: Right common carotid clot continues continuously into the right M1 and inferior division M2 branches. Some  superior division branches are opacified. An anterior communicating artery is present, clot narrows the right A1 origin. 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 There is a 4 mm neck. Posterior circulation: Symmetric vertebral arteries. Left posterior communicating artery. No major branch occlusion or flow limiting stenosis noted. Venous sinuses: As permitted by contrast timing, patent. Anatomic variants: None significant Delayed phase: Not performed in the emergent setting Review of the MIP images confirms the above findings CT Brain Perfusion Findings: CBF (<30%) Volume:  80 mL Perfusion (Tmax>6.0s) volume: Mismatch Volume: Infarction Location:Densest infarct involves the right insula, operculum, internal capsule/putamen and antro lateral frontal cortex. These results were called by telephone at the time of interpretation on 11/22/2016 at 11:07 am to Dr. Roxy Manns , who verbally acknowledged these results. IMPRESSION: 1. 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 Electronically Signed   By: Marnee Spring M.D.   On: 11/22/2016 11:27   Ct Cerebral Perfusion W Contrast  Result Date: 11/22/2016 CLINICAL DATA:  Left hemiplegia EXAM: CT ANGIOGRAPHY HEAD AND NECK CT PERFUSION BRAIN TECHNIQUE: Multidetector CT imaging of the head and neck was performed using the standard protocol during bolus administration of  intravenous contrast. Multiplanar CT image reconstructions and MIPs were obtained to evaluate the vascular anatomy. Carotid stenosis measurements (when applicable) are obtained utilizing NASCET criteria, using the distal internal carotid diameter as the denominator. Multiphase CT imaging of the brain was performed following IV bolus contrast injection. Subsequent parametric perfusion  maps were calculated using RAPID software. CONTRAST:  90 cc Isovue 370 intravenous COMPARISON:  Head CT from earlier today FINDINGS: CTA NECK FINDINGS Aortic arch: Atherosclerosis. Three vessel branching. 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 The clot is contiguous to the level of the right M2 segment. Left carotid system: Mild for age scattered calcified atherosclerotic plaque. No flow limiting stenosis or ulceration. 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 No flow limiting stenosis. No evidence of dissection. Skeleton: There is a lucency in the right occipital bone contiguous with jugular foramen which has smooth sclerotic margins, compatible with a benign process. This is likely an enlarged emissary vein based on density. No second bone lesion. Other neck: Negative Upper chest: Negative Review of the MIP images confirms the above findings CTA HEAD FINDINGS Anterior circulation: Right common carotid clot continues continuously into the right M1 and inferior division M2 branches. Some superior division branches are opacified. An anterior communicating artery is present, clot narrows the right A1 origin. 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 There is a 4 mm neck. Posterior circulation: Symmetric vertebral arteries. Left posterior communicating artery. No major branch occlusion or flow limiting stenosis noted. Venous sinuses: As permitted by contrast timing, patent. Anatomic variants: None significant Delayed phase: Not performed in the emergent setting Review of the MIP images confirms the above findings CT Brain Perfusion Findings: CBF (<30%) Volume:  80 mL Perfusion (Tmax>6.0s) volume: Mismatch Volume: Infarction Location:Densest infarct involves the right insula, operculum, internal capsule/putamen and antro lateral frontal  cortex. These results were called by telephone at the time of interpretation on 11/22/2016 at 11:07 am to Dr. Roxy Manns , who verbally acknowledged these results. IMPRESSION: 1. 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 Electronically Signed   By: Marnee Spring M.D.   On: 11/22/2016 11:27    Procedures Procedures (including critical care time)  Medications Ordered in ED Medications  sodium chloride 0.9 % bolus 500 mL (not administered)    Followed by  0.9 %  sodium chloride infusion (not administered)  iopamidol (ISOVUE-370) 76 % injection (not administered)   Immediately after the initial evaluation with concern for new stroke I discussed her case with our neurology colleagues, and the patient was sent for emergent CT, CT angiogram.  I discussed patient's findings with our radiologist, and subsequently with our neurology team. Given the breadth of effect, patient is not a candidate for interventional repair Initial Impression / Assessment and Plan / ED Course  I have reviewed the triage vital signs and the nursing notes.  Pertinent labs & imaging results that were available during my care of the patient were reviewed by me and considered in my medical decision making (see chart for details).  On repeat exam the patient remains in similar condition. All findings discussed with the patient's family. With concern for stroke, but no indication for emergent intravascular intervention, patient will require  admission for further evaluation, management, and initiation of physical therapy.   Final Clinical Impressions(s) / ED Diagnoses   Final diagnoses:  Paralysis [facility])  Stroke   Gerhard Munch, MD 11/22/16 1228

## 2016-11-22 NOTE — ED Notes (Signed)
Admitting team at bedside.

## 2016-11-22 NOTE — H&P (Signed)
Date: 11/22/2016               Patient Name:  Katherine Mueller MRN: [id number]  DOB: 12/17/1940 Age / Sex: 76 y.o., female   PCP: No primary care provider on file.         Medical Service: Internal Medicine Teaching Service         Attending Physician: Dr. Gust RungErik C Hoffman, DO    First Contact: Dr. Carolynn CommentBryan Chrishawna Farina Pager: [phone]  Second Contact: Dr. Orest Dikeshris Rice Pager: [phone]       After Hours (After 5p /  First Contact Pager: [phone]  Weekends / Holidays): Second Contact Pager: [phone]   Chief Complaint: Left hemiparesis  History of Present Illness: Katherine Mueller is a 76 y.o. female with a h/o of HTN, HLD, GERD, anxiety, and insomnia who presents with acute onset of total left hemiparesis.  Patient was reportedly in her normal state of health until last night when she went to bed between 10 and 11 PM. Her has reports that she had no deficits at the time he last saw her around 9 PM when he went to bed. She woke this morning with left paralysis of the upper and lower extremity dysarthria and left facial droop. Husband reports that he went to check on her around 8:30 this morning when she requested Tylenol for pain in her right elbow and hip area when he brought her glass of water he noticed her facial droop. At this time he called EMS. Patient denies any history of strokes in the past. She took pravastatin daily for primary prevention.  In the ED, she was found to have complete occlusion of the proximal R MCA with large ischemic defect (80mL area of hypoperfusion). She was outside the window of thrombolytic therapy and Neurology was consulted. They recommended that the pt would be a poor candidate for VIR intervention given the extent of clot and risk of hemorrhagic conversion.  Pt was also found to be in A-fib w/o RVR, HDS, HR 70s. Pt reports that she has had a-fib in the past, but has not been on anticoagulation. Review of the EMR and CareEverywhere Records from [facility] providers [facility] does not show any evidence of prior afib. She was noted to have "rapid heart rate" on her last cardiology note from 03/02/16 and has been treated with metoprolol succ. She notes that has a h/o PUD w/ ASA therapy, but has never been on any other blood thinner.  Meds: Current Facility-Administered Medications  Medication Dose Route Frequency Provider Last Rate Last Dose  . 0.9 %  sodium chloride infusion  100 mL/hr Intravenous Continuous Gerhard Munchobert Lockwood, MD 100 mL/hr at 11/22/16 1234 100 mL/hr at 11/22/16 1234   Current Outpatient Prescriptions  Medication Sig Dispense Refill  . acetaminophen (TYLENOL) 325 MG tablet Take 650 mg by mouth every 6 (six) hours as needed for mild pain.    . B Complex-C (B-COMPLEX WITH VITAMIN C) tablet Take 1 tablet by mouth daily.    . diphenhydrAMINE (BENADRYL) 25 MG tablet Take 25 mg by mouth every 6 (six) hours as needed for sleep.    Marland Kitchen. gabapentin (NEURONTIN) 300 MG capsule Take 300 mg by mouth 3 (three) times daily.    . hydrochlorothiazide (HYDRODIURIL) 25 MG tablet Take 25 mg by mouth daily.    Marland Kitchen. LORazepam (ATIVAN) 0.5 MG tablet Take 0.5 mg by mouth every 8 (eight) hours as needed for anxiety.    . metoprolol  succinate (TOPROL-XL) 50 MG 24 hr tablet Take 50 mg by mouth daily. Take with or immediately following a meal.    . montelukast (SINGULAIR) 10 MG tablet Take 10 mg by mouth at bedtime.    . Multiple Vitamin (MULTIVITAMIN) tablet Take 1 tablet by mouth daily.    Marland Kitchen omeprazole (PRILOSEC) 20 MG capsule Take 20 mg by mouth 2 (two) times daily before a meal.    . pravastatin (PRAVACHOL) 20 MG tablet Take 20 mg by mouth daily.    Marland Kitchen zolpidem (AMBIEN) 5 MG tablet Take 5 mg by mouth at bedtime as needed for sleep.     Allergies: Allergies as of 11/22/2016 - Review Complete 11/22/2016  Allergen Reaction Noted  . Bee venom Swelling 08/26/2015  . Meperidine Swelling 08/26/2015  . Azithromycin Other (See Comments) 08/26/2015  . Celecoxib Other (See Comments)  03/07/2016  . Latex Other (See Comments) 08/26/2015  . Betamethasone Swelling 10/21/2015  . Prednisone Rash 08/26/2015   Past Medical History:  Diagnosis Date  . A-fib (HCC)   . High cholesterol   . Hypertension    Family History: Pt does not have any family h/o stroke or coagulopathy.  Social History: Pt  does not have a smoking history on file. She has never used smokeless tobacco. She reports that she does not drink alcohol.  Review of Systems: A complete ROS was negative except as per HPI. Review of Systems  Constitutional: Negative for chills, fever and weight loss.  Eyes: Negative for blurred vision.  Respiratory: Negative for cough and shortness of breath.   Cardiovascular: Negative for chest pain and leg swelling.  Gastrointestinal: Negative for abdominal pain, constipation, diarrhea, nausea and vomiting.  Genitourinary: Negative for dysuria, frequency and urgency.  Musculoskeletal: Negative for myalgias.  Skin: Negative for rash.  Neurological: Positive for sensory change (L), speech change, focal weakness (L) and headaches. Negative for dizziness, tremors and loss of consciousness.  Endo/Heme/Allergies: Negative for polydipsia.  Psychiatric/Behavioral: The patient is not nervous/anxious.    Physical Exam: Vitals:   11/22/16 1145 11/22/16 1202 11/22/16 1215 11/22/16 1231  BP: (!) 115/103 144/69 159/75 (!) 166/105  Pulse: 72   69  Resp: 14 (!) 9 10 20   SpO2: 100%   92%   Physical Exam  Constitutional: She is oriented to person, place, and time. She appears well-developed. She is cooperative. No distress.  HENT:  Head: Normocephalic and atraumatic.  Right Ear: Hearing normal.  Left Ear: Hearing normal.  Nose: Nose normal.  Mouth/Throat: Mucous membranes are normal.  Cardiovascular: Normal rate, regular rhythm, S1 normal, S2 normal and intact distal pulses.  Exam reveals no gallop.   No murmur heard. Pulmonary/Chest: Effort normal and breath sounds normal. No  respiratory distress. She has no wheezes. She has no rhonchi. She has no rales. She exhibits no tenderness. Breasts are symmetrical.  Abdominal: Soft. Normal appearance and bowel sounds are normal. She exhibits no ascites. There is no hepatosplenomegaly. There is no tenderness. There is no CVA tenderness.  Musculoskeletal: Normal range of motion.  Neurological: She is alert and oriented to person, place, and time. She has normal strength. A cranial nerve deficit (L facial droop (eyebrow preserved), L tongue deviation, L gaze preference) and sensory deficit (hemiparasthesia LUE and LLE) is present. She exhibits abnormal muscle tone (flacid paralysis of LUE and LLE). She displays no Babinski's sign on the right side. She displays Babinski's sign on the left side.  Strength: 4+/5 RUE   0/5 LUE  4+/5 RLE   0/5 LLE  Skin: Skin is warm, dry and intact. She is not diaphoretic.  Psychiatric: She has a normal mood and affect. Her speech is normal and behavior is normal.   Labs: CBC:  Recent Labs Lab 11/22/16 1113  WBC 8.9  NEUTROABS 6.2  HGB 13.7  HCT 39.6  MCV 83.5  PLT 166   Basic Metabolic Panel:  Recent Labs Lab 11/22/16 1113  NA 137  K 3.4*  CL 103  CO2 27  GLUCOSE 97  BUN 17  CREATININE 0.85  CALCIUM 8.9   Cardiac Enzymes:  Recent Labs Lab 11/22/16 1113  TROPONINI <0.03   Coagulation Studies:  Recent Labs  11/22/16 1113  LABPROT 14.3  INR 1.11   Liver Function Tests:  Recent Labs Lab 11/22/16 1113  AST 20  ALT 19  ALKPHOS 41  BILITOT 0.9  PROT 6.0*  ALBUMIN 3.6    Recent Labs Lab 11/22/16 1038  GLUCAP 90   Imaging: EKG Interpretation  Date/Time:  Tuesday November 22 2016 10:37:08 EST Ventricular Rate:  66 PR Interval:    QRS Duration: 83 QT Interval:  450 QTC Calculation: 472 R Axis:   63 Text Interpretation:  Atrial fibrillation Borderline T wave abnormalities Baseline wander in lead(s) II III aVF Abnormal ekg Confirmed by  Gerhard Munch  MD ([id number]) on 11/22/2016 10:40:15 AM  Ct Head Wo Contrast  Result Date: 11/22/2016 CLINICAL DATA:  Left facial droop and left sided hemiparesis. EXAM: CT HEAD WITHOUT CONTRAST TECHNIQUE: Contiguous axial images were obtained from the base of the skull through the vertex without intravenous contrast. COMPARISON:  None. FINDINGS: Brain: Evidence of cytotoxic edema in the right MCA territory consistent with a large infarction. There is a dense right MCA sign. In no hemorrhage. No extra-axial fluid collections. Ventricles are in the midline without significant mass effect or shift. The right lateral ventricle may be slightly flattened. Underlying atrophy and periventricular white matter disease. Vascular: Dense right MCA sign.  Vascular calcifications noted. Skull: No skull fracture or bone lesions. Sinuses/Orbits: The paranasal sinuses and mastoid air cells are clear. The globes are intact. Other: No scalp lesion or hematoma. 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 No intracranial hemorrhage. Underlying cerebral atrophy and periventricular white matter disease. These results were called by telephone at the time of interpretation on 11/22/2016 at 11:03 am to Dr. Gerhard Munch , who verbally acknowledged these results. Electronically Signed   By: Rudie Meyer M.D.   On: 11/22/2016 11:03   Ct Angio Neck W Or Wo Contrast Ct Cerebral Perfusion W Contrast  Result Date: 11/22/2016 CLINICAL DATA:  Left hemiplegia EXAM: CT ANGIOGRAPHY HEAD AND NECK CT PERFUSION BRAIN TECHNIQUE: Multidetector CT imaging of the head and neck was performed using the standard protocol during bolus administration of intravenous contrast. Multiplanar CT image reconstructions and MIPs were obtained to evaluate the vascular anatomy. Carotid stenosis measurements (when applicable) are obtained utilizing NASCET criteria, using the distal internal carotid diameter as the denominator.  Multiphase CT imaging of the brain was performed following IV bolus contrast injection. Subsequent parametric perfusion maps were calculated using RAPID software. CONTRAST:  90 cc Isovue 370 intravenous COMPARISON:  Head CT from earlier today FINDINGS: CTA NECK FINDINGS Aortic arch: Atherosclerosis. Three vessel branching. 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 The clot is contiguous to the level of the right M2 segment. Left carotid system: Mild for age scattered  calcified atherosclerotic plaque. No flow limiting stenosis or ulceration. 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 No flow limiting stenosis. No evidence of dissection. Skeleton: There is a lucency in the right occipital bone contiguous with jugular foramen which has smooth sclerotic margins, compatible with a benign process. This is likely an enlarged emissary vein based on density. No second bone lesion. Other neck: Negative Upper chest: Negative Review of the MIP images confirms the above findings CTA HEAD FINDINGS Anterior circulation: Right common carotid clot continues continuously into the right M1 and inferior division M2 branches. Some superior division branches are opacified. An anterior communicating artery is present, clot narrows the right A1 origin. 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 There is a 4 mm neck. Posterior circulation: Symmetric vertebral arteries. Left posterior communicating artery. No major branch occlusion or flow limiting stenosis noted. Venous sinuses: As permitted by contrast timing, patent. Anatomic variants: None significant Delayed phase: Not performed in the emergent setting Review of the MIP images confirms the above findings CT Brain Perfusion Findings: CBF (<30%) Volume:  80 mL Perfusion (Tmax>6.0s) volume: Mismatch Volume:  Infarction Location:Densest infarct involves the right insula, operculum, internal capsule/putamen and antro lateral frontal cortex. These results were called by telephone at the time of interpretation on 11/22/2016 at 11:07 am to Dr. Roxy Manns , who verbally acknowledged these results. IMPRESSION: 1. 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 Electronically Signed   By: Marnee Spring M.D.   On: 11/22/2016 11:27   Assessment & Plan by Problem: Active Problems:   Acute ischemic right MCA stroke [facility])  Katherine Mueller is a 76 y.o. 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  1) R MCA Infarct: Large deficits with complete L hemiparesis, L facial droop (L forehead preserved, dysarthria, leftward deviating tongue, rightward deviating uvula. She has extensor plantar response on the left. Reported urinary retention w/ concern for neurogenic bladder. Given extent of clot, Neurology thinks less likely embolic, however, pt w/ hx of afib w/o AC. Large area of hypoperfusion suggests poor candidate for VIR clot retrieval. Plan for full stroke w/u. Will likely need intensive rehabilitation for recovery from and compensation for deficits. - admit to tele - Neurology c/s, appreciate recs - MRI Brain - echo - carotids - Lipid, A1c - ASA 325mg  - intensify statin to atorva 40mg  - PT/OT/SLP - Bladder scan for urinary retention, I/O cath PRN  2) HTN: Home meds: HCTZ and metop succ (afib). Hold for permissive HTN <220/120. Pressures have been borderline soft, w/ systolics ranging from 110s-160s. - hold home HCTZ - IVF for pressure support at 100cc/hr  3) Afib: Presented in afib. HDS w/ normal rates. Unclear h/o afib. No prior AC. Will hold [facility] acutely for risk of hemorrhagic  conversion. Consider risk/benefit of AC in the future. CHA2DS2-VASc 4 (moderate w/ 4.8% annual risk). HASBLED 3-4 (5.8-8.9% risk of major GI bleed, pending more information on h/o bleeding from reported PUD). - continue metop succ BID - consider Cardiology c/s for new onset afib recs and future management  4) GERD w/ h/o PUD: Pt on PPI therapy at home and reports h/o PUD specifically w/ ASA therapy.  Will plan to escalate PPI therapy in the setting of ASA therapy for 2nd stroke ppx. - Protonix 40mg  BID  5) GAD / Insomnia: Pt has a h/o anxiety and is currently taking Ativan 0.5mg  TID PRN. She also take 5mg  zolpidem nightly for insomnia. 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 She has peripheral neuropathy of unknown etiology for which she take gabapentin 300mg  TID. - continue home meds  DVT PPx - SCD's while in bed  Code Status - DO NOT RESUSCITATE  Consults Placed - neurology  Dispo: Admit patient to Inpatient with expected length of stay greater than 2 midnights.  Signed: Carolynn Comment, MD 11/22/2016, 1:18 PM  Pager: [phone]

## 2016-11-22 NOTE — ED Notes (Signed)
Neuro at bedside discussing further treatment with family based off of CT results and pt condition no further treatment recommended at this time such as the IR lab

## 2016-11-22 NOTE — ED Notes (Signed)
Pt up to bedpan for less than 1 cc urine.

## 2016-11-23 DIAGNOSIS — M1611 Unilateral primary osteoarthritis, right hip: Secondary | ICD-10-CM

## 2016-11-23 DIAGNOSIS — G629 Polyneuropathy, unspecified: Secondary | ICD-10-CM

## 2016-11-23 DIAGNOSIS — F411 Generalized anxiety disorder: Secondary | ICD-10-CM

## 2016-11-23 DIAGNOSIS — G4700 Insomnia, unspecified: Secondary | ICD-10-CM

## 2016-11-23 DIAGNOSIS — E785 Hyperlipidemia, unspecified: Secondary | ICD-10-CM

## 2016-11-23 DIAGNOSIS — Z66 Do not resuscitate: Secondary | ICD-10-CM

## 2016-11-23 DIAGNOSIS — I10 Essential (primary) hypertension: Secondary | ICD-10-CM

## 2016-11-23 DIAGNOSIS — I480 Paroxysmal atrial fibrillation: Secondary | ICD-10-CM

## 2016-11-23 DIAGNOSIS — Z79899 Other long term (current) drug therapy: Secondary | ICD-10-CM

## 2016-11-23 DIAGNOSIS — Z8711 Personal history of peptic ulcer disease: Secondary | ICD-10-CM

## 2016-11-23 DIAGNOSIS — I4891 Unspecified atrial fibrillation: Secondary | ICD-10-CM | POA: Diagnosis present

## 2016-11-23 DIAGNOSIS — K219 Gastro-esophageal reflux disease without esophagitis: Secondary | ICD-10-CM

## 2016-11-23 LAB — BASIC METABOLIC PANEL
Anion gap: 9 (ref 5–15)
BUN: 10 mg/dL (ref 6–20)
CALCIUM: 9.1 mg/dL (ref 8.9–10.3)
CO2: 25 mmol/L (ref 22–32)
CREATININE: 0.72 mg/dL (ref 0.44–1.00)
Chloride: 105 mmol/L (ref 101–111)
Glucose, Bld: 120 mg/dL — ABNORMAL HIGH (ref 65–99)
Potassium: 3.2 mmol/L — ABNORMAL LOW (ref 3.5–5.1)
SODIUM: 139 mmol/L (ref 135–145)

## 2016-11-23 LAB — CBC
HCT: 41.2 % (ref 36.0–46.0)
Hemoglobin: 13.9 g/dL (ref 12.0–15.0)
MCH: 28.3 pg (ref 26.0–34.0)
MCHC: 33.7 g/dL (ref 30.0–36.0)
MCV: 83.7 fL (ref 78.0–100.0)
PLATELETS: 194 10*3/uL (ref 150–400)
RBC: 4.92 MIL/uL (ref 3.87–5.11)
RDW: 13.3 % (ref 11.5–15.5)
WBC: 9.6 10*3/uL (ref 4.0–10.5)

## 2016-11-23 MED ORDER — GLYCOPYRROLATE 1 MG PO TABS
1.0000 mg | ORAL_TABLET | ORAL | Status: DC | PRN
  Filled 2016-11-23: qty 1

## 2016-11-23 MED ORDER — HALOPERIDOL LACTATE 5 MG/ML IJ SOLN: 0.5000 mg | INTRAMUSCULAR | Status: DC | PRN

## 2016-11-23 MED ORDER — HALOPERIDOL LACTATE 2 MG/ML PO CONC
0.5000 mg | ORAL | Status: DC | PRN
  Filled 2016-11-23: qty 0.3

## 2016-11-23 MED ORDER — ONDANSETRON 4 MG PO TBDP: 4.0000 mg | ORAL_TABLET | Freq: Four times a day (QID) | ORAL | Status: DC | PRN

## 2016-11-23 MED ORDER — BIOTENE DRY MOUTH MT LIQD: 15.0000 mL | OROMUCOSAL | Status: DC | PRN

## 2016-11-23 MED ORDER — PANTOPRAZOLE SODIUM 40 MG PO TBEC: 40.0000 mg | DELAYED_RELEASE_TABLET | Freq: Two times a day (BID) | ORAL | Status: DC

## 2016-11-23 MED ORDER — ACETAMINOPHEN 325 MG PO TABS
650.0000 mg | ORAL_TABLET | Freq: Four times a day (QID) | ORAL | Status: DC | PRN
  Administered 2016-11-23 (×2): 650 mg via ORAL
  Filled 2016-11-23 (×2): qty 2

## 2016-11-23 MED ORDER — POLYVINYL ALCOHOL 1.4 % OP SOLN
1.0000 [drp] | Freq: Four times a day (QID) | OPHTHALMIC | Status: DC | PRN
  Filled 2016-11-23: qty 15

## 2016-11-23 MED ORDER — MORPHINE SULFATE (PF) 2 MG/ML IV SOLN
1.0000 mg | INTRAVENOUS | Status: DC | PRN
  Filled 2016-11-23: qty 1

## 2016-11-23 MED ORDER — GLYCOPYRROLATE 0.2 MG/ML IJ SOLN: 0.2000 mg | INTRAMUSCULAR | Status: DC | PRN

## 2016-11-23 MED ORDER — HALOPERIDOL 1 MG PO TABS: 0.5000 mg | ORAL_TABLET | ORAL | Status: DC | PRN

## 2016-11-23 MED ORDER — LORAZEPAM 1 MG PO TABS: 1.0000 mg | ORAL_TABLET | ORAL | Status: DC | PRN

## 2016-11-23 MED ORDER — LORAZEPAM 2 MG/ML IJ SOLN: 1.0000 mg | INTRAMUSCULAR | Status: DC | PRN

## 2016-11-23 MED ORDER — LORAZEPAM 2 MG/ML PO CONC: 1.0000 mg | ORAL | Status: DC | PRN

## 2016-11-23 MED ORDER — POTASSIUM CHLORIDE CRYS ER 20 MEQ PO TBCR: 40.0000 meq | EXTENDED_RELEASE_TABLET | Freq: Two times a day (BID) | ORAL | Status: DC

## 2016-11-23 MED ORDER — ACETAMINOPHEN 650 MG RE SUPP: 650.0000 mg | Freq: Four times a day (QID) | RECTAL | Status: DC | PRN

## 2016-11-23 MED ORDER — ONDANSETRON HCL 4 MG/2ML IJ SOLN
4.0000 mg | Freq: Four times a day (QID) | INTRAMUSCULAR | Status: DC | PRN
  Administered 2016-11-23: 4 mg via INTRAVENOUS
  Filled 2016-11-23: qty 2

## 2016-11-23 NOTE — Discharge Summary (Signed)
Name: Katherine Mueller MRN: [id number] DOB: 10/22/1940 76 y.o. PCP: No primary care provider on file.  Date of Admission: 11/22/2016 10:34 AM Date of Discharge: 11/25/2016 Attending Physician: Gust RungErik C Hoffman, DO  Discharge Diagnosis: Principal Problem:   Acute ischemic right MCA stroke [facility](HCC) Active Problems:   Hyperlipidemia LDL goal <70   Hypertension   Atrial fibrillation [facility](HCC)   Discharge Medications: Allergies as of 11/25/2016      Reactions   Bee Venom Swelling   Meperidine Swelling   Swelling of lips   Azithromycin Other (See Comments)   unspecified   Celecoxib Other (See Comments)   unspecified   Latex Other (See Comments)   unspecified   Betamethasone Swelling   Facial swelling after steroid injection   Prednisone Rash      Medication List    STOP taking these medications   B-complex with vitamin C tablet   diphenhydrAMINE 25 MG tablet Commonly known as:  BENADRYL Replaced by:  diphenhydrAMINE 12.5 MG/5ML elixir   gabapentin 300 MG capsule Commonly known as:  NEURONTIN   hydrochlorothiazide 25 MG tablet Commonly known as:  HYDRODIURIL   metoprolol succinate 50 MG 24 hr tablet Commonly known as:  TOPROL-XL   montelukast 10 MG tablet Commonly known as:  SINGULAIR   multivitamin tablet   omeprazole 20 MG capsule Commonly known as:  PRILOSEC   pravastatin 20 MG tablet Commonly known as:  PRAVACHOL     TAKE these medications   acetaminophen 325 MG tablet Commonly known as:  TYLENOL Take 2 tablets (650 mg total) by mouth every 6 (six) hours as needed for mild pain (or Fever >/= 101). What changed:  reasons to take this   antiseptic oral rinse Liqd Apply 15 mLs topically as needed for dry mouth.   diphenhydrAMINE 12.5 MG/5ML elixir Commonly known as:  BENADRYL Take 5 mLs (12.5 mg total) by mouth at bedtime as needed for allergies or sleep. Replaces:  diphenhydrAMINE 25 MG tablet   glycopyrrolate 1 MG tablet Commonly known as:  ROBINUL Take 1  tablet (1 mg total) by mouth every 4 (four) hours as needed (excessive secretions).   haloperidol 0.5 MG tablet Commonly known as:  HALDOL Take 1 tablet (0.5 mg total) by mouth every 4 (four) hours as needed for agitation (or delirium).   LORazepam 1 MG tablet Commonly known as:  ATIVAN Take 1 tablet (1 mg total) by mouth every 4 (four) hours as needed for anxiety. What changed:  medication strength  how much to take  when to take this   ondansetron 4 MG disintegrating tablet Commonly known as:  ZOFRAN-ODT Take 1 tablet (4 mg total) by mouth every 6 (six) hours as needed for nausea.   oxyCODONE 5 MG immediate release tablet Commonly known as:  Oxy IR/ROXICODONE Take 1 tablet (5 mg total) by mouth every 4 (four) hours as needed for severe pain.   polyvinyl alcohol 1.4 % ophthalmic solution Commonly known as:  LIQUIFILM TEARS Place 1 drop into both eyes 4 (four) times daily as needed for dry eyes.   zolpidem 5 MG tablet Commonly known as:  AMBIEN Take 5 mg by mouth at bedtime as needed for sleep.       Disposition and follow-up:   [name] was [facility] in Stable condition.  At the hospital follow up visit please address:  1.  R MCA CVA w/ poor prognosis on comfort care: comfort measures only. Referral to hospice.  Follow-up Appointments: Follow-up Information    Hospice at [facility] Follow up.   Specialty:  Hospice and Palliative Medicine Contact information: [address] [place] [address] [phone]          Hospital Course by problem list: Principal Problem:   Acute ischemic right MCA stroke [facility]) Active Problems:   Hyperlipidemia LDL goal <70   Hypertension   Atrial fibrillation (HCC)   1. R MCA Infarct:Pt presented w/ complete L hemiparesis and hemiparesthesia, L facial droop (L forehead preserved), dysarthria, leftward deviating tongue. She has extensor plantar response on the left. Last known  well on 11/21/16 prior to bed and awoke the next morning with these deficits. Urinary retention initially, but then resolved. She presented to the ED in afib w/o RVR. MRI demonstrated complete MCA territory infarct. Given location of infarct and A-fib, Neurology is concerned for cardioembolic source. Not a candidate for intraarterial intervention. High risk for reperfusion injury and hemorrhagic conversion. Prognosis is grim. Neuro discussed with family and they wish to pursue comfort care only.Further workup was discontinued. Comfort measures initiated. Hospice referral was provided.  Discharge Vitals:   BP 140/78 (BP Location: Right Arm)   Pulse 81   Temp 99.2 F (37.3 C) (Oral)   Resp 18   SpO2 96%   Pertinent Labs, Studies, and Procedures: MR Head and Perfusion study: 1. 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  Consultations: Neurology  Discharge Instructions: Discharge Instructions    Diet - low sodium heart healthy    Complete by:  As directed    Discharge instructions    Complete by:  As directed    Your stroke has a very poor prognosis and you have chosen to focus on your comfort. You have been referred to hospice whose services can provide much support to make sure that we maximize your quality of life.   Increase activity slowly    Complete by:  As directed      Signed: Carolynn Comment, MD 11/25/2016, 7:53 AM   Pager: [phone]

## 2016-11-23 NOTE — Progress Notes (Signed)
  Speech Language Pathology Treatment: Dysphagia  Patient Details Name: DALY WHIPKEY MRN: [id number] DOB: 12/14/1940 Today's Date: 11/23/2016 Time: 6943-7005 SLP Time Calculation (min) (ACUTE ONLY): 21 min  Assessment / Plan / Recommendation Clinical Impression  FEES was scheduled for today, however family and pt have now opted for comfort feeds. SLP provided diagnostic treatment to determine least restrictive diet. Observed pt with thin liquids via cup/straw which resulted in coughing during initial  trials. Mastication of regular solids was timely and pt was able to clear oral residue with tongue. Educated pt and family re: diet recommendations and informed family that there is still risk for aspiration with this diet; family demonstrated understanding. Emphasized importance of upright positioning during mealtime with small sips/bites, slow rate, and lingual sweep for clearance of pocketing. Recommend regular solids, thin liquids (no straws), meds crushed. Pt likely will fatigue due to severity of stroke; regular texture diet will allow family to order softer textures and increased choices. No further ST needs at this time; ST signing off.   HPI HPI: 76 y.o.femaleadmitted with left-sided weakness, found to have large right MCA infarct which was not amendable to TPA or intra-arterial surgery. PMH includes HTN, a-fib.      SLP Plan  All goals met       Recommendations  Diet recommendations: Regular;Thin liquid Liquids provided via: Cup;No straw Medication Administration: Crushed with puree Supervision: Intermittent supervision to cue for compensatory strategies;Patient able to self feed Compensations: Slow rate;Small sips/bites;Lingual sweep for clearance of pocketing Postural Changes and/or Swallow Maneuvers: Seated upright 90 degrees                Follow up Recommendations: Other (comment) (TBD) SLP Visit Diagnosis: Dysphagia, unspecified (R13.10) Plan: All goals met       [place] , Student-SLP 11/23/2016, 2:44 PM

## 2016-11-23 NOTE — Progress Notes (Signed)
STROKE TEAM PROGRESS NOTE   SUBJECTIVE (INTERVAL HISTORY) Her son and daughter-in-law are at the bedside.  Her other son and husband have taken a walk (they arrived back during rounds). Pt lying in bed. Per son, she went to bed with a HA last night. Not a tPA candidate d/t time and not an IR candidate due to large size of stroke already present on perfusion.   Patient looks better than expected with this type of stroke. Currently, we are not seeing swelling, but remains at risk for neuro worsening.  Dr. Pearlean Brownie spoke frankly with pt's sons and husband related to dx, prognosis and long-term disability expected, including 24/7 care. Currently NCB. Hopefully, swelling will not increase but we do expect progressive neuro worsening..    OBJECTIVE Temp:  [97.5 F (36.4 C)-97.6 F (36.4 C)] 97.6 F (36.4 C) (03/07 0517) Pulse Rate:  [53-75] 65 (03/07 0517) Cardiac Rhythm: Atrial fibrillation (03/06 1900) Resp:  [9-21] 18 (03/07 0517) BP: (90-166)/(34-136) 128/62 (03/07 0517) SpO2:  [92 %-100 %] 92 % (03/07 0517)  CBC:  Recent Labs Lab 11/22/16 1113  WBC 8.9  NEUTROABS 6.2  HGB 13.7  HCT 39.6  MCV 83.5  PLT 166    Basic Metabolic Panel:  Recent Labs Lab 11/22/16 1113  NA 137  K 3.4*  CL 103  CO2 27  GLUCOSE 97  BUN 17  CREATININE 0.85  CALCIUM 8.9    Lipid Panel: No results found for: CHOL, TRIG, HDL, CHOLHDL, VLDL, LDLCALC HgbA1c: No results found for: HGBA1C   PHYSICAL EXAM Elderly Caucasian lady not in distress. . Afebrile. Head is nontraumatic. Neck is supple without bruit.    Cardiac exam no murmur or gallop. Lungs are clear to auscultation. Distal pulses are well felt. Neurological Exam :  Drowsy but arouses easily.Right gaze preference. Unable to look to the left. Blinks to threat on the right but not the left. Dysarthric speech but can be easily understood. Fundi not visualized. Left lower facial weakness. Tongue midline. Dense left hemiplegia with left  hemi-neglect. Patient does not recognize her own left arm. Purposeful antigravity strength on the right. Withdraws left upper and lower extremity to painful stimuli slightly. Left plantar upgoing right downgoing. Tone is diminished on the left and normal on the right. ASSESSMENT/PLAN Ms. Katherine Mueller is a 76 y.o. female with history of atrial fibrillation, HTN, HLD, GERD, anxiety and insomnia presenting with L hemiparesis and R gaze deviation. She did not receive IV t-PA due to delay in arrival.   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 No ICH. Cerebral atrophy and PV white matter disease.   CTA head acute LVO prox R common carotid to R M2 branches  CTA neck 6mm suproclinoid ICA aneurysm  CT perfusion R MCA ischemia with 80cc core infarct, mismatch  MRI  canceled  2D Echo  Not ordered  LDL not ordered  HgbA1c not ordered  SCDs for VTE prophylaxis  Diet NPO time specified Except for: Other (See Comments) - pt at risk for aspiration PNA given large size of stroke - made completely NPO this am. ST had asked about FEES. Will defer due to desire for comfort.  No antithrombotic prior to admission, now ordered aspirin 325 mg daily though pt is NPO   Patient has a  very large stroke that few survive without permanent disability. She is at risk for progressive neuro worsening due to edema, which typically peaks in 3-5 days. Discussion as above. Husband and sons are agreeable that they just want to keep her comfortable, they do not want aggressive care.  Disposition:  pending   Consider residential hospice / home with hospice. (resident of [place]). Palliative care if attending team not comfortable addressing.  Atrial Fibrillation  Home anticoagulation:  No  antithrombotic   CHA2DS2-VASc Score = 6, ?2 oral anticoagulation recommended  Age in Years:  ?[age]   +2    Sex:  Female   Female   +1    Hypertension History:  yes   +1     Diabetes Mellitus:  0   Congestive Heart Failure History:  0  Vascular Disease History:  0     Stroke/TIA/Thromboembolism History:  yes   +2    Hypertension  Permissive hypertension (OK if < 220/120) but gradually normalize in 5-7 days  Long-term BP goal normotensive  Hyperlipidemia  Home meds:  pravachol 20  Statin increased to atorvastatin 80 mg daily  Continue statin at discharge  Other Stroke Risk Factors  Advanced age  UDS negative  Other Active Problems  GERD with h/o PUD  Anxiety/insomnia   OA/peripheral neuropathy  Dr. Pearlean BrownieSethi called and discussed with attending team. Stroke team will sign off. Please call for any questions.   Hospital day # 1  Rhoderick MoodyBIBY,SHARON  [facility] See Amion for Pager information 11/23/2016 10:38 AM  I have personally examined this patient, reviewed notes, independently viewed imaging studies, participated in medical decision making and plan of care.ROS completed by me personally and pertinent positives fully documented  I have made any additions or clarifications directly to the above note. Agree with note above. The patient unfortunately presented with a large right MCA infarct due to right internal carotid occlusion from atrial fibrillation and CT perfusion did  Demonstrate a large ischemic core which was not amenable to endovascular therapy. Her prognosis is poor and family is quite clear that patient would not want to live a life of disability, have artificial tube feeding, ventilatory support or live in a nursing home hence they agree on full comfort care. Greater than 50% time during this 35 minute visit was spent on counseling and coordination of care about her large stroke, development of impending cerebral edema, stroke related disability and answering  questions Stroke team will sign off. Kindly call for questions Delia HeadyPramod Munachimso Rigdon, MD Medical Director [facility] Pager: [phone] 11/23/2016 1:42 PM  To contact Stroke Continuity provider, please refer to [email or web address]. After hours, contact General Neurology

## 2016-11-23 NOTE — H&P (Signed)
Internal Medicine Attending Admission Note  I saw and evaluated the patient. I reviewed the resident's note and I agree with the resident's findings and plan as documented in the resident's note.  Assessment & Plan by Problem:   Acute ischemic right MCA stroke (HCC) - Likely cardioembolic from Afib, no A/C now due to risk of conversion, will eventually need A/C - Allow for permissive HTN - ASA 325mg  - PT, OT, SLP -Echo, carotid dopplers - Discussed care, updated Husband and son at bedside.  Active Problems:   Hyperlipidemia LDL goal <70 -Atorvastatin 40    Hypertension - Allow for permissive HTN    Paroxsymal Atrial fibrillation (HCC) - No A/C at this time due to size of CVA -CHADSVASC of 6   Chief Complaint(s): left side weakness  History - key components related to admission: Briefly Katherine Mueller is a 76 yo female with history of hypertension, hyperlipidemia, GERD, anxiety, peptic ulcer disease and insomnia who presented yesterday for acute onset of left-sided weakness. She was last seen normal the night prior to admission around 11 PM by her husband. In the morning around 8:30 AM husband checked on her and she requested Tylenol for right elbow pain. At that time he appreciated left-sided facial droop and that she was not moving her left arm. Therefore he called EMS for evaluation. In the ED a stat CT of the head showed MCA distribution infarct with large ischemic deficit. She was outside the window for systemic TPA. She was felt to be a poor candidate for mechanical intra-arterial intervention due to the extent infarct. She was also noted to be in A. fib on presentation.  Lab results: Reviewed in Epic  Physical Exam - key components related to admission: General: resting in bed HEENT: PERRL, EOMI, no scleral icterus Cardiac: irr irr Pulm: clear to auscultation bilaterally, moving normal volumes of air Abd: soft, nontender, nondistended, BS present Ext: warm and well  perfused, no pedal edema Neuro: alert and oriented, left facial droop, flacid paralysis of LUE and LLE, + babinski on left  Vitals:   11/22/16 1617 11/22/16 2038 11/23/16 0101 11/23/16 0517  BP: 131/82 (!) 117/55 90/64 128/62  Pulse: 69 62 70 65  Resp: 16 20 18 18   Temp: 97.6 F (36.4 C) 97.5 F (36.4 C) 97.6 F (36.4 C) 97.6 F (36.4 C)  TempSrc: Oral Oral Oral Oral  SpO2: 99% 92% 98% 92%

## 2016-11-23 NOTE — Progress Notes (Addendum)
Subjective: Currently, the patient is feeling poor. Complains of diffuse soreness throughout. No specific complaints. Still w/ full motor and sensory paralysis on L. Neurology s/w family regarding grim prognosis and comfort care was chosen w/o further w/u.  Objective: Vital signs in last 24 hours: Vitals:   11/22/16 1617 11/22/16 2038 11/23/16 0101 11/23/16 0517  BP: 131/82 (!) 117/55 90/64 128/62  Pulse: 69 62 70 65  Resp: 16 20 18 18   Temp: 97.6 F (36.4 C) 97.5 F (36.4 C) 97.6 F (36.4 C) 97.6 F (36.4 C)  TempSrc: Oral Oral Oral Oral  SpO2: 99% 92% 98% 92%   Physical Exam: Physical Exam  Constitutional: She is oriented to person, place, and time. She appears well-developed. She appears lethargic. She is cooperative. No distress.  Cardiovascular: Normal rate, normal heart sounds and normal pulses.  An irregularly irregular rhythm present. Exam reveals no gallop.   No murmur heard. Pulmonary/Chest: Effort normal and breath sounds normal. No respiratory distress. Breasts are symmetrical.  Abdominal: Soft. Bowel sounds are normal. There is no tenderness.  Musculoskeletal: She exhibits no edema.  Neurological: She is oriented to person, place, and time. She appears lethargic. She displays no atrophy. A cranial nerve deficit (L facial droop (forehead preserved), dysarthria, left tongue deviation) and sensory deficit (LUE and LLE hemiparesthesia) is present. She exhibits abnormal muscle tone (LUE and LLE flaccid hemiparesis). She displays Babinski's sign on the left side.   Labs: CBC:  Recent Labs Lab 11/22/16 1113 11/23/16 0815  WBC 8.9 9.6  NEUTROABS 6.2  --   HGB 13.7 13.9  HCT 39.6 41.2  MCV 83.5 83.7  PLT 166 194   Metabolic Panel:  Recent Labs Lab 11/22/16 1113 11/23/16 0815  NA 137 139  K 3.4* 3.2*  CL 103 105  CO2 27 25  GLUCOSE 97 120*  BUN 17 10  CREATININE 0.85 0.72  CALCIUM 8.9 9.1  ALT 19  --   ALKPHOS 41  --   BILITOT 0.9  --   PROT 6.0*  --    ALBUMIN 3.6  --   LABPROT 14.3  --   INR 1.11  --    Cardiac Labs:  Recent Labs Lab 11/22/16 1113  TROPONINI <0.03   BG:  Recent Labs Lab 11/22/16 1038  GLUCAP 90   Imaging: MR Head and Perfusion study: 1. 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   Medications: PRN Medications: acetaminophen **OR** acetaminophen, antiseptic oral rinse, glycopyrrolate **OR** glycopyrrolate **OR** glycopyrrolate, haloperidol **OR** haloperidol **OR** haloperidol lactate, LORazepam **OR** LORazepam **OR** LORazepam, morphine injection, ondansetron **OR** ondansetron (ZOFRAN) IV, oxyCODONE, polyvinyl alcohol, zolpidem  Assessment/Plan: Ms. Oneita Hurtancy G Vancuren is a 76 y.o. 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  Comfort Care: R MCA Infarct: Large deficits with complete L hemiparesis and hemiparesthesia, L facial droop (L forehead preserved), dysarthria, leftward deviating tongue. She has extensor plantar response on the left. Urinary retention initially, but now resolved. Given location of infarct and A-fib, Neurology is concerned for cardioembolic. Not a candidate for intraarterial intervention. High risk for reperfusion injury and hemorrhagic conversion. Prognosis is grim. Neuro discussed with family and they wish to pursue comfort care only. - further w/u cancelled - diet as requested - morphine 1mg  q2h  PRN pain or air hunger - ativan 1mg  q4h PRN anxiety - zofran 4mg  q6h PRN nausea - oral care, glycopyrrolate PRN  Length of Stay: 1 day(s) Dispo: Anticipated discharge to inpatient hospice. CM consult placed for referral.  Carolynn Comment, MD Pager: [phone] (7AM-5PM) 11/23/2016, 10:23 AM

## 2016-11-23 NOTE — Care Management Note (Signed)
Case Management Note  Patient Details  Name: Katherine Mueller MRN: [id number] Date of Birth: Feb 14, 1941  Subjective/Objective:   Pt admitted with CVA. She is from home with spouse.                 Action/Plan: CM consulted per MD for hospice. CM met with the family and they prefer residential hospice. Family lives in [place] and would like [facility]. CSW informed.   Expected Discharge Date:                  Expected Discharge Plan:  [place]  In-House Referral:  Clinical Social Work  Discharge planning Services  CM Consult  Post Acute Care Choice:    Choice offered to:     DME Arranged:    DME Agency:     HH Arranged:    [place] Agency:     Status of Service:  In process, will continue to follow  If discussed at Long Length of Stay Meetings, dates discussed:    Additional Comments:  Pollie Friar, RN 11/23/2016, 7:01 PM

## 2016-11-23 NOTE — Progress Notes (Signed)
  Speech Language Pathology Treatment: Dysphagia  Patient Details Name: Oneita Hurtancy G Blanck MRN: [id number] DOB: 03/10/1941 Today's Date: 11/23/2016 Time: [id number] SLP Time Calculation (min) (ACUTE ONLY): 15 min  Assessment / Plan / Recommendation Clinical Impression  F/u diagnostic dysphagia treatment complete. Po trials provided. Patient more alert today, remains lethargic but able to maintain alert state with fewer cues (min) that during evaluation 3/8. Able to self feed for consumption of thin liquids and pureed solids without overt s/s of aspiration however severity of neuro deficits including CN VII involvement increases risk of silent aspiration, particularly with larger bolus sizes. Recommend instrumental testing to determine extent of dysphagia and least restrictive diet. Plan for FEES this pm pending order from MD.   HPI HPI: 76 y.o.femaleadmitted with left-sided weakness, found to have large right MCA infarct which was not amendable to TPA or intra-arterial surgery. PMH includes HTN, a-fib.      SLP Plan   (FEES)       Recommendations  Diet recommendations: NPO                General recommendations: Rehab consult Oral Care Recommendations: Oral care QID Follow up Recommendations: Inpatient Rehab SLP Visit Diagnosis: Dysphagia, unspecified (R13.10) Plan:  (FEES)       GO              Ferdinand LangoLeah Breena Bevacqua MA, CCC-SLP [phone]  Courtne Lighty Meryl 11/23/2016, 9:40 AM

## 2016-11-24 MED ORDER — ACETAMINOPHEN 325 MG PO TABS: 650.0000 mg | ORAL_TABLET | Freq: Four times a day (QID) | ORAL | 0 refills | Status: AC | PRN

## 2016-11-24 MED ORDER — DIPHENHYDRAMINE HCL 12.5 MG/5ML PO ELIX
12.5000 mg | ORAL_SOLUTION | Freq: Every evening | ORAL | Status: DC | PRN
  Administered 2016-11-24 (×2): 12.5 mg via ORAL

## 2016-11-24 MED ORDER — BIOTENE DRY MOUTH MT LIQD: 15.0000 mL | OROMUCOSAL | 0 refills | Status: AC | PRN

## 2016-11-24 MED ORDER — OXYCODONE HCL 5 MG PO TABS: 5.0000 mg | ORAL_TABLET | ORAL | 0 refills | Status: AC | PRN

## 2016-11-24 MED ORDER — ONDANSETRON 4 MG PO TBDP: 4.0000 mg | ORAL_TABLET | Freq: Four times a day (QID) | ORAL | 0 refills | Status: AC | PRN

## 2016-11-24 MED ORDER — POLYVINYL ALCOHOL 1.4 % OP SOLN: 1.0000 [drp] | Freq: Four times a day (QID) | OPHTHALMIC | 0 refills | Status: AC | PRN

## 2016-11-24 MED ORDER — GLYCOPYRROLATE 1 MG PO TABS: 1.0000 mg | ORAL_TABLET | ORAL | 0 refills | Status: AC | PRN

## 2016-11-24 MED ORDER — HALOPERIDOL 0.5 MG PO TABS: 0.5000 mg | ORAL_TABLET | ORAL | 0 refills | Status: AC | PRN

## 2016-11-24 MED ORDER — LORAZEPAM 1 MG PO TABS: 1.0000 mg | ORAL_TABLET | ORAL | 0 refills | Status: AC | PRN

## 2016-11-24 NOTE — Consult Note (Signed)
[facility] Liaison Received request from CSW 11/23/16 for family [facility]. Chart reviewed and spoke with family outside room as requested. Updated RNCM Kelli and CSW [place] after meeting with family. Will follow up with family later this morning. Tonette BihariJeneya is aware of this plan. Please do not hesitate to call with questions.   Thank you,  Forrestine Himva Davis, LCSW [phone]

## 2016-11-24 NOTE — Progress Notes (Signed)
Internal Medicine Attending:   I saw and examined the patient. I reviewed the resident's note and I agree with the resident's findings and plan as documented in the resident's note. Patient has had some mild symptoms of HA and nausea overnight however this morning is feeling well.  Family updated at bedside and wish to pursue inpatient hospice at [facility].

## 2016-11-24 NOTE — Consult Note (Signed)
[facility] room available for Katherine Mueller tomorrow, 11/25/16. Met with patient's spouse and d-i-l to complete paper work for transfer tomorrow. Dr. Orpah Melter to assume care per family preference. RNCM and CSW aware of above.   Please fax discharge summary to [phone].  RN please call report to [phone].  Thank you,  Erling Conte, LCSW [phone]

## 2016-11-24 NOTE — Progress Notes (Addendum)
Subjective: Currently, the patient actually feeling better this morning. Episodes of HA and Nausea ON treated with PRN meds. No complaints today. L deficits unchanged. Expect continued worsening of cerebral edema over next 2-3 days with worsening of mental status, HA, nausea, and likely eventually death. Hospice has been consulted and [facility].  Objective: Vital signs in last 24 hours: Vitals:   11/22/16 2038 11/23/16 0101 11/23/16 0517 11/23/16 2108  BP: (!) 117/55 90/64 128/62 (!) 154/63  Pulse: 62 70 65 88  Resp: 20 18 18 18   Temp: 97.5 F (36.4 C) 97.6 F (36.4 C) 97.6 F (36.4 C) 98.7 F (37.1 C)  TempSrc: Oral Oral Oral Oral  SpO2: 92% 98% 92% 100%   Physical Exam: Physical Exam  Constitutional: She is oriented to person, place, and time. She appears well-developed. She appears lethargic. She is cooperative. No distress.  Cardiovascular: Normal rate, normal heart sounds and normal pulses.  An irregularly irregular rhythm present. Exam reveals no gallop.   No murmur heard. Pulmonary/Chest: Effort normal and breath sounds normal. No respiratory distress. Breasts are symmetrical.  Abdominal: Soft. Bowel sounds are normal. There is no tenderness.  Musculoskeletal: She exhibits no edema.  Neurological: She is oriented to person, place, and time. She appears lethargic. She displays no atrophy. A cranial nerve deficit (L facial droop (forehead preserved), dysarthria, left tongue deviation) and sensory deficit (LUE and LLE hemiparesthesia) is present. She exhibits abnormal muscle tone (LUE and LLE flaccid hemiparesis). She displays Babinski's sign on the left side.   Labs: CBC:  Recent Labs Lab 11/22/16 1113 11/23/16 0815  WBC 8.9 9.6  NEUTROABS 6.2  --   HGB 13.7 13.9  HCT 39.6 41.2  MCV 83.5 83.7  PLT 166 194   Metabolic Panel:  Recent Labs Lab 11/22/16 1113 11/23/16 0815  NA 137 139  K 3.4* 3.2*  CL 103 105  CO2  27 25  GLUCOSE 97 120*  BUN 17 10  CREATININE 0.85 0.72  CALCIUM 8.9 9.1  ALT 19  --   ALKPHOS 41  --   BILITOT 0.9  --   PROT 6.0*  --   ALBUMIN 3.6  --   LABPROT 14.3  --   INR 1.11  --    Cardiac Labs:  Recent Labs Lab 11/22/16 1113  TROPONINI <0.03   BG:  Recent Labs Lab 11/22/16 1038  GLUCAP 90   Imaging: MR Head and Perfusion study: 1. 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   Medications: PRN Medications: acetaminophen **OR** acetaminophen, antiseptic oral rinse, glycopyrrolate **OR** glycopyrrolate **OR** glycopyrrolate, haloperidol **OR** haloperidol **OR** haloperidol lactate, LORazepam **OR** LORazepam **OR** LORazepam, morphine injection, ondansetron **OR** ondansetron (ZOFRAN) IV, oxyCODONE, polyvinyl alcohol, zolpidem  Assessment/Plan: Ms. Katherine Mueller is a 76 y.o. 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  Comfort Care: R MCA Infarct: Large deficits with complete L hemiparesis and hemiparesthesia, L facial droop (L forehead preserved), dysarthria, leftward deviating tongue. She has extensor plantar response on the left. Urinary retention initially, but now resolved. Given location of infarct and A-fib, Neurology is concerned for cardioembolic. Not a candidate for intraarterial intervention. High risk for reperfusion injury and hemorrhagic conversion. Cerebral edema expected  to be massive ultimately resulting in death. Prognosis is grim. Neuro discussed with family and they wish to pursue comfort care only. - [facility] - further w/u cancelled - diet as requested - morphine 1mg  q2h PRN pain or air hunger - ativan 1mg  q4h PRN anxiety - zofran 4mg  q6h PRN nausea - oral care,  glycopyrrolate PRN  Length of Stay: 2 day(s) Dispo: Anticipated discharge to inpatient hospice. CM consult placed for referral. [facility] bed reportedly available tomorrow. Will plan for discharge tomorrow morning.  Carolynn Comment, MD Pager: [phone] (7AM-5PM) 11/24/2016, 11:00 AM

## 2016-11-25 DIAGNOSIS — Z881 Allergy status to other antibiotic agents status: Secondary | ICD-10-CM

## 2016-11-25 DIAGNOSIS — Z888 Allergy status to other drugs, medicaments and biological substances status: Secondary | ICD-10-CM

## 2016-11-25 DIAGNOSIS — Z91030 Bee allergy status: Secondary | ICD-10-CM

## 2016-11-25 DIAGNOSIS — Z91040 Latex allergy status: Secondary | ICD-10-CM

## 2016-11-25 MED ORDER — DIPHENHYDRAMINE HCL 12.5 MG/5ML PO ELIX
12.5000 mg | ORAL_SOLUTION | Freq: Every evening | ORAL | 0 refills | Status: AC | PRN
Start: 2016-11-25 — End: ?

## 2016-11-25 NOTE — Clinical Social Work Note (Signed)
Clinical Social Work Assessment  Patient Details  Name: EILA RUNYAN MRN: [id number] Date of Birth: [birth year]  Date of referral:  11/25/16               Reason for consult:  Discharge Planning                Permission sought to share information with:  Family Supports, Facility Sport and exercise psychologist Permission granted to share information::  Yes, Verbal Permission Granted  Name::     Jaylon Grode  Agency::     Relationship::  Spouse  Contact Information:  [phone]  Housing/Transportation Living arrangements for the past 2 months:  Single Family Home Source of Information:  Spouse Patient Interpreter Needed:  None Criminal Activity/Legal Involvement Pertinent to Current Situation/Hospitalization:  No - Comment as needed Significant Relationships:  Spouse, Adult Children Lives with:  Spouse Do you feel safe going back to the place where you live?  Yes Need for family participation in patient care:  Yes (Comment)  Care giving concerns:  No caregiving concerns identified.    Social Worker assessment / plan:  CSW consulted for Hospice Residential placement. CSW met with pt and spouse-Gary Moodie at bedside. Pt laying in bed peacefully. Spouse reported son is in the waiting room and there is good family support. Spouse stated family Doristine Bosworth has been to the room and is actively supporting family. Family met with Harmon Pier [place]) yesterday to discuss [facility]. Hospice paperwork completed, per Harmon Pier.   CSW faxed d/c summary to [phone]. Report number put in treatment team sticky note and RN made aware. Arranged transportation with [facility]. Signed DNR on chart. Harmon Pier, RN, and family aware. CSW signing off as no further needs identified.   Employment status:  Retired Forensic scientist:  Other (Comment Required) ([employer]) PT Recommendations:  Not assessed at this time Information / Referral to community resources:  Other (Comment Required) (Hospice  Residential)  Patient/Family's Response to care:  Family is pleased with CSW support and staff support.  Patient/Family's Understanding of and Emotional Response to Diagnosis, Current Treatment, and Prognosis:  Family understands that pt is terminally ill and feels she has "lived a good life" and she is "ready" to transition pt to [facility].   Emotional Assessment Appearance:  Appears stated age Attitude/Demeanor/Rapport:   (Appropriate) Affect (typically observed):  Quiet Orientation:  Oriented to Self, Oriented to Place, Oriented to  Time, Oriented to Situation Alcohol / Substance use:  Other Psych involvement (Current and /or in the community):  No (Comment)  Discharge Needs  Concerns to be addressed:  Care Coordination Readmission within the last 30 days:  No Current discharge risk:  Terminally ill Barriers to Discharge:  Continued Medical Work up   [employer], LCSW 11/25/2016, 11:16 AM

## 2016-11-25 NOTE — Progress Notes (Signed)
Subjective: Currently, the patient is still feeling comfortable today. Several small episodes of agitation ON. She does endorse some mild return of sensation to the LLE and LUE. Still w/ upward Babinski L foot. Ready for transition to [facility].  Objective: Vital signs in last 24 hours: Vitals:   11/23/16 0517 11/23/16 2108 11/24/16 1504 11/24/16 2128  BP: 128/62 (!) 154/63 114/77 140/78  Pulse: 65 88 81 81  Resp: 18 18 17 18   Temp: 97.6 F (36.4 C) 98.7 F (37.1 C) 98.3 F (36.8 C) 99.2 F (37.3 C)  TempSrc: Oral Oral Oral Oral  SpO2: 92% 100% 96%    Physical Exam: Physical Exam  Constitutional: She is oriented to person, place, and time. She appears well-developed. She appears lethargic. She is cooperative. No distress.  Cardiovascular: Normal rate, normal heart sounds and normal pulses.  An irregularly irregular rhythm present. Exam reveals no gallop.   No murmur heard. Pulmonary/Chest: Effort normal and breath sounds normal. No respiratory distress. Breasts are symmetrical.  Abdominal: Soft. Bowel sounds are normal. There is no tenderness.  Musculoskeletal: She exhibits no edema.  Neurological: She is oriented to person, place, and time. She appears lethargic. She displays no atrophy. A cranial nerve deficit (L facial droop (forehead preserved), dysarthria, left tongue deviation) and sensory deficit (LUE and LLE hemiparesthesia) is present. She exhibits abnormal muscle tone (LUE and LLE flaccid hemiparesis). She displays Babinski's sign on the left side.   Labs: CBC:  Recent Labs Lab 11/22/16 1113 11/23/16 0815  WBC 8.9 9.6  NEUTROABS 6.2  --   HGB 13.7 13.9  HCT 39.6 41.2  MCV 83.5 83.7  PLT 166 194   Metabolic Panel:  Recent Labs Lab 11/22/16 1113 11/23/16 0815  NA 137 139  K 3.4* 3.2*  CL 103 105  CO2 27 25  GLUCOSE 97 120*  BUN 17 10  CREATININE 0.85 0.72  CALCIUM 8.9 9.1  ALT 19  --   ALKPHOS 41  --   BILITOT 0.9  --   PROT 6.0*  --     ALBUMIN 3.6  --   LABPROT 14.3  --   INR 1.11  --    Cardiac Labs:  Recent Labs Lab 11/22/16 1113  TROPONINI <0.03   BG:  Recent Labs Lab 11/22/16 1038  GLUCAP 90   Imaging: MR Head and Perfusion study: 1. 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   Medications: PRN Medications: acetaminophen **OR** acetaminophen, antiseptic oral rinse, diphenhydrAMINE, glycopyrrolate **OR** glycopyrrolate **OR** glycopyrrolate, haloperidol **OR** haloperidol **OR** haloperidol lactate, LORazepam **OR** LORazepam **OR** LORazepam, morphine injection, ondansetron **OR** ondansetron (ZOFRAN) IV, oxyCODONE, polyvinyl alcohol, zolpidem  Assessment/Plan: Katherine Mueller is a 75 y.o. 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  Comfort Care: R MCA Infarct: Large deficits with complete L hemiparesis and hemiparesthesia, L facial droop (L forehead preserved), dysarthria, leftward deviating tongue. She has extensor plantar response on the left. Urinary retention initially, but now resolved. Given location of infarct and A-fib, Neurology is concerned for cardioembolic. Not a candidate for intraarterial intervention. High risk for reperfusion injury and hemorrhagic conversion. Cerebral edema expected to be massive ultimately resulting in death. Prognosis is grim. Neuro discussed with family and they wish to pursue comfort  care only. - [facility] c/[facility]/ [facility] - further w/u cancelled - diet as requested - morphine 1mg  q2h PRN pain or air hunger - ativan 1mg  q4h PRN anxiety - zofran 4mg  q6h PRN nausea - oral care, glycopyrrolate PRN  Length of Stay: 3 day(s) Dispo: Anticipated discharge to inpatient hospice today.  Carolynn CommentBryan Idriss Quackenbush, MD Pager:  [phone] (7AM-5PM) 11/25/2016, 7:54 AM

## 2016-11-25 NOTE — Progress Notes (Signed)
Patient [facility]. IV removed. Family aware of discharge instructions. Transported from unit via [facility]. Lawson RadarHeather M Shelagh Mueller

## 2016-11-25 NOTE — Care Management Important Message (Signed)
Important Message  Patient Details  Name: Katherine Mueller MRN: [id number] Date of Birth: 06/27/1941   Medicare Important Message Given:  Yes    Dorena BodoIris Rainie Crenshaw 11/25/2016, 12:19 PM

## 2016-11-25 NOTE — Progress Notes (Signed)
Internal Medicine Attending:   I saw and examined the patient. I reviewed the resident's note and I agree with the resident's findings and plan as documented in the resident's note. Patient remains comfortable no change in neuro exam. Son updated at bedside. [facility] will [facility]

## 2016-11-26 ENCOUNTER — Other Ambulatory Visit: Payer: Self-pay | Admitting: Internal Medicine

## 2016-11-28 ENCOUNTER — Other Ambulatory Visit: Payer: Self-pay | Admitting: Internal Medicine

## 2017-03-12 ENCOUNTER — Emergency Department (HOSPITAL_COMMUNITY)
Admission: EM | Admit: 2017-03-12 | Discharge: 2017-03-12 | Disposition: A | Discharge status: Home / Self Care | Payer: Medicare Other | Attending: Emergency Medicine | Admitting: Emergency Medicine

## 2017-03-12 ENCOUNTER — Encounter (HOSPITAL_COMMUNITY): Payer: Self-pay

## 2017-03-12 DIAGNOSIS — Z7901 Long term (current) use of anticoagulants: Secondary | ICD-10-CM | POA: Diagnosis not present

## 2017-03-12 DIAGNOSIS — Z8673 Personal history of transient ischemic attack (TIA), and cerebral infarction without residual deficits: Secondary | ICD-10-CM | POA: Insufficient documentation

## 2017-03-12 DIAGNOSIS — R310 Gross hematuria: Secondary | ICD-10-CM | POA: Insufficient documentation

## 2017-03-12 DIAGNOSIS — I10 Essential (primary) hypertension: Secondary | ICD-10-CM | POA: Diagnosis not present

## 2017-03-12 DIAGNOSIS — Z91040 Latex allergy status: Secondary | ICD-10-CM | POA: Diagnosis not present

## 2017-03-12 DIAGNOSIS — Z79899 Other long term (current) drug therapy: Secondary | ICD-10-CM | POA: Insufficient documentation

## 2017-03-12 DIAGNOSIS — R319 Hematuria, unspecified: Secondary | ICD-10-CM | POA: Diagnosis present

## 2017-03-12 HISTORY — DX: Cerebral infarction, unspecified: I63.9

## 2017-03-12 LAB — COMPREHENSIVE METABOLIC PANEL
ALK PHOS: 38 U/L (ref 38–126)
ALT: 31 U/L (ref 14–54)
ANION GAP: 7 (ref 5–15)
AST: 28 U/L (ref 15–41)
Albumin: 3.3 g/dL — ABNORMAL LOW (ref 3.5–5.0)
BILIRUBIN TOTAL: 0.9 mg/dL (ref 0.3–1.2)
BUN: 17 mg/dL (ref 6–20)
CALCIUM: 8.7 mg/dL — AB (ref 8.9–10.3)
CO2: 23 mmol/L (ref 22–32)
CREATININE: 0.57 mg/dL (ref 0.44–1.00)
Chloride: 106 mmol/L (ref 101–111)
GFR calc Af Amer: >60 mL/min (ref >60)
GFR calc non Af Amer: >60 mL/min (ref >60)
GLUCOSE: 120 mg/dL — AB (ref 65–99)
Potassium: 3.1 mmol/L — ABNORMAL LOW (ref 3.5–5.1)
SODIUM: 136 mmol/L (ref 135–145)
TOTAL PROTEIN: 5.3 g/dL — AB (ref 6.5–8.1)

## 2017-03-12 LAB — URINALYSIS, MICROSCOPIC (REFLEX)
Bacteria, UA: NONE SEEN
Squamous Epithelial / LPF: NONE SEEN
WBC, UA: NONE SEEN WBC/hpf (ref 0–5)

## 2017-03-12 LAB — URINALYSIS, ROUTINE W REFLEX MICROSCOPIC
Bilirubin Urine: NEGATIVE
GLUCOSE, UA: NEGATIVE mg/dL
Ketones, ur: NEGATIVE mg/dL
Leukocytes, UA: NEGATIVE
Nitrite: NEGATIVE
PH: 6 (ref 5.0–8.0)
Protein, ur: 100 mg/dL — AB
SPECIFIC GRAVITY, URINE: 1.03 (ref 1.005–1.030)

## 2017-03-12 LAB — CBC WITH DIFFERENTIAL/PLATELET
BASOS PCT: 1 %
Basophils Absolute: 0 10*3/uL (ref 0.0–0.1)
Eosinophils Absolute: 0.3 10*3/uL (ref 0.0–0.7)
Eosinophils Relative: 4 %
HEMATOCRIT: 36.2 % (ref 36.0–46.0)
HEMOGLOBIN: 11.9 g/dL — AB (ref 12.0–15.0)
Lymphocytes Relative: 19 %
Lymphs Abs: 1.1 10*3/uL (ref 0.7–4.0)
MCH: 28.7 pg (ref 26.0–34.0)
MCHC: 32.9 g/dL (ref 30.0–36.0)
MCV: 87.2 fL (ref 78.0–100.0)
Monocytes Absolute: 0.5 10*3/uL (ref 0.1–1.0)
Monocytes Relative: 9 %
NEUTROS ABS: 3.8 10*3/uL (ref 1.7–7.7)
NEUTROS PCT: 67 %
Platelets: 206 10*3/uL (ref 150–400)
RBC: 4.15 MIL/uL (ref 3.87–5.11)
RDW: 13.4 % (ref 11.5–15.5)
WBC: 5.8 10*3/uL (ref 4.0–10.5)

## 2017-03-12 LAB — I-STAT CG4 LACTIC ACID, ED
LACTIC ACID, VENOUS: 1.39 mmol/L (ref 0.5–1.9)
Lactic Acid, Venous: 1.51 mmol/L (ref 0.5–1.9)

## 2017-03-12 MED ORDER — SODIUM CHLORIDE 0.9 % IV BOLUS (SEPSIS)
500.0000 mL | Freq: Once | INTRAVENOUS | Status: AC
  Administered 2017-03-12: 500 mL via INTRAVENOUS

## 2017-03-12 MED ORDER — POTASSIUM CHLORIDE CRYS ER 20 MEQ PO TBCR
40.0000 meq | EXTENDED_RELEASE_TABLET | Freq: Once | ORAL | Status: AC
  Administered 2017-03-12: 40 meq via ORAL
  Filled 2017-03-12: qty 2

## 2017-03-12 NOTE — ED Notes (Signed)
[facility] called for transport.  

## 2017-03-12 NOTE — ED Notes (Signed)
Encourage pt to void pt states she is unable to do so at this time. EDP made aware.

## 2017-03-12 NOTE — ED Notes (Signed)
Dr. Schlossman at bedside. 

## 2017-03-12 NOTE — ED Triage Notes (Signed)
PER EMS: pt is from home, family reports they noticed dark red blood in her brief today. Family reports they believe pt is urinating the blood and states she has had dark urine since her return [facility]. [facility] (was there for a stroke, left side paralysis). Pt does take blood thinners per family. Pt is A&OX4, denies being in any pain.

## 2017-03-12 NOTE — ED Notes (Signed)
Departure from 1826 in error 1828 correct.

## 2017-03-12 NOTE — ED Provider Notes (Signed)
[facility]-EMERGENCY DEPT Provider Note   CSN: [id number] Arrival date & time:        History   Chief Complaint Chief Complaint  Patient presents with  . Hematuria    HPI Katherine Mueller is a 76 y.o. female.  HPI   76 year old female with a history of CVA with left-sided hemiparesis, hypertension, chronic atrial fibrillation on L [place]quin[place], presents with concern for hematuria. Family reports that she had dark colored urine when she initially returned [facility], however yesterday it had improved and was cleared. They noted today that she had significant darkening of her urine, appearance of gross hematuria. Describe it as tea colored.  They report that when they went to change today noticed a large amount in her depends. Denies presence of clots. She had previously had urinary symptoms of dysuria, but does not anymore. Patient denies any abdominal pain, flank pain, nausea, vomiting or fevers. Denies any other concerns. Reports she's been having normal bowel movements without blood.  Past Medical History:  Diagnosis Date  . A-fib (HCC)   . High cholesterol   . Hypertension   . Stroke [facility](HCC)     Patient Active Problem List   Diagnosis Date Noted  . Atrial fibrillation (HCC) 11/23/2016  . Acute ischemic right MCA stroke (HCC) 11/22/2016  . GAD (generalized anxiety disorder) 04/05/2016  . Gastroesophageal reflux disease without esophagitis 04/05/2016  . Peripheral polyneuropathy 04/05/2016  . Hyperlipidemia LDL goal <70 08/26/2015  . Hypertension 08/26/2015  . Mitral regurgitation 08/26/2015  . Mitral valve insufficiency 08/26/2015    History reviewed. No pertinent surgical history.  OB History    No data available       Home Medications    Prior to Admission medications   Medication Sig Start Date End Date Taking? Authorizing Provider  acetaminophen (TYLENOL) 500 MG tablet Take 1,000 mg by mouth 3 (three) times daily as needed for mild pain.   Yes [provider]  amLODipine (NORVASC) 5 MG tablet Take 5 mg by mouth every evening. 03/10/17 06/08/17 Yes [provider]  ciprofloxacin (CIPRO) 500 MG tablet Take 500 mg by mouth 2 (two) times daily. Taken for 5 days. 03/10/17 03/15/17 Yes [provider]  diphenhydrAMINE (BENADRYL) 25 mg capsule Take 25 mg by mouth every 6 (six) hours as needed for allergies.   Yes [provider]  ELIQUIS 5 MG TABS tablet Take 5 mg by mouth 2 (two) times daily. 03/10/17  Yes [provider]  gabapentin (NEURONTIN) 100 MG capsule Take 200 mg by mouth every evening. 02/24/17  Yes [provider]  metoprolol tartrate (LOPRESSOR) 50 MG tablet Take 50 mg by mouth 2 (two) times daily. 03/09/17  Yes [provider]  montelukast (SINGULAIR) 10 MG tablet Take 10 mg by mouth every evening. 03/10/17  Yes [provider]  omeprazole (PRILOSEC) 20 MG capsule Take 20 mg by mouth as needed for heartburn. 03/05/17  Yes [provider]  pramipexole (MIRAPEX) 0.5 MG tablet Take 0.5 mg by mouth every evening. 03/09/17 04/08/17 Yes [provider]  zolpidem (AMBIEN) 5 MG tablet Take 5 mg by mouth at bedtime as needed for sleep.   Yes [provider]  acetaminophen (TYLENOL) 325 MG tablet Take 2 tablets (650 mg total) by mouth every 6 (six) hours as needed for mild pain (or Fever >/= 101). Patient not taking: Reported on 03/12/2017 11/24/16   Carolynn CommentStrelow, Bryan, MD  antiseptic oral rinse (BIOTENE) LIQD Apply 15 mLs topically as  needed for dry mouth. Patient not taking: Reported on 03/12/2017 11/24/16   Carolynn Comment, MD  diphenhydrAMINE (BENADRYL) 12.5 MG/5ML elixir Take 5 mLs (12.5 mg total) by mouth at bedtime as needed for allergies or sleep. Patient not taking: Reported on 03/12/2017 11/25/16   Carolynn Comment, MD  glycopyrrolate (ROBINUL) 1 MG tablet Take 1 tablet (1 mg total) by mouth every 4 (four) hours as needed (excessive secretions). Patient not taking:  Reported on 03/12/2017 11/24/16   Carolynn Comment, MD  haloperidol (HALDOL) 0.5 MG tablet Take 1 tablet (0.5 mg total) by mouth every 4 (four) hours as needed for agitation (or delirium). Patient not taking: Reported on 03/12/2017 11/24/16   Carolynn Comment, MD  LORazepam (ATIVAN) 1 MG tablet Take 1 tablet (1 mg total) by mouth every 4 (four) hours as needed for anxiety. Patient not taking: Reported on 03/12/2017 11/24/16   Carolynn Comment, MD  ondansetron (ZOFRAN-ODT) 4 MG disintegrating tablet Take 1 tablet (4 mg total) by mouth every 6 (six) hours as needed for nausea. Patient not taking: Reported on 03/12/2017 11/24/16   Carolynn Comment, MD  oxyCODONE (OXY IR/ROXICODONE) 5 MG immediate release tablet Take 1 tablet (5 mg total) by mouth every 4 (four) hours as needed for severe pain. Patient not taking: Reported on 03/12/2017 11/24/16   Carolynn Comment, MD  polyvinyl alcohol (LIQUIFILM TEARS) 1.4 % ophthalmic solution Place 1 drop into both eyes 4 (four) times daily as needed for dry eyes. Patient not taking: Reported on 03/12/2017 11/24/16   Carolynn Comment, MD    Family History Family History  Problem Relation Age of Onset  . Stroke Sister 12    Social History Social History  Substance Use Topics  . Smoking status: Never Smoker  . Smokeless tobacco: Never Used  . Alcohol use No     Allergies   Bee venom; Meperidine; Azithromycin; Celecoxib; Latex; Codeine; Morphine and related; Betamethasone; and Prednisone   Review of Systems Review of Systems  Constitutional: Negative for fever.  HENT: Negative for sore throat.   Eyes: Negative for visual disturbance.  Respiratory: Negative for cough and shortness of breath.   Cardiovascular: Negative for chest pain.  Gastrointestinal: Negative for abdominal pain, nausea and vomiting.  Genitourinary: Positive for hematuria. Negative for difficulty urinating.  Musculoskeletal: Negative for back pain and neck pain.  Skin: Negative for rash.  Neurological:  Negative for syncope and headaches.     Physical Exam Updated Vital Signs BP 111/72   Pulse 95   Temp 98.1 F (36.7 C) (Rectal)   Resp 12   SpO2 98%   Physical Exam  Constitutional: She is oriented to person, place, and time. She appears well-developed and well-nourished. She appears ill (chronically). No distress.  HENT:  Head: Normocephalic and atraumatic.  Eyes: Conjunctivae and EOM are normal.  Neck: Normal range of motion.  Cardiovascular: Normal rate, regular rhythm, normal heart sounds and intact distal pulses.  Exam reveals no gallop and no friction rub.   No murmur heard. Pulmonary/Chest: Effort normal and breath sounds normal. No respiratory distress. She has no wheezes. She has no rales.  Abdominal: Soft. She exhibits no distension. There is no tenderness. There is no guarding.  Musculoskeletal: She exhibits no edema or tenderness.  Neurological: She is alert and oriented to person, place, and time.  Left hemiparesis  Skin: Skin is warm and dry. No rash noted. She is not diaphoretic. No erythema.  Nursing note and vitals reviewed.    ED Treatments / Results  Labs (all labs ordered are listed, but only abnormal results are displayed) Labs Reviewed  CBC WITH DIFFERENTIAL/PLATELET - Abnormal; Notable for the following:       Result Value   Hemoglobin 11.9 (*)    All other components within normal limits  COMPREHENSIVE METABOLIC PANEL - Abnormal; Notable for the following:    Potassium 3.1 (*)    Glucose, Bld 120 (*)    Calcium 8.7 (*)    Total Protein 5.3 (*)    Albumin 3.3 (*)    All other components within normal limits  URINALYSIS, ROUTINE W REFLEX MICROSCOPIC - Abnormal; Notable for the following:    Color, Urine AMBER (*)    APPearance TURBID (*)    Hgb urine dipstick LARGE (*)    Protein, ur 100 (*)    All other components within normal limits  URINE CULTURE  URINALYSIS, MICROSCOPIC (REFLEX)  I-STAT CG4 LACTIC ACID, ED  I-STAT CG4 LACTIC ACID, ED      EKG  EKG Interpretation None       Radiology No results found.  Procedures Procedures (including critical care time)  Medications Ordered in ED Medications  sodium chloride 0.9 % bolus 500 mL (0 mLs Intravenous Stopped 03/12/17 1337)  potassium chloride SA (K-DUR,KLOR-CON) CR tablet 40 mEq (40 mEq Oral Given 03/12/17 1738)     Initial Impression / Assessment and Plan / ED Course  I have reviewed the triage vital signs and the nursing notes.  Pertinent labs & imaging results that were available during my care of the patient were reviewed by me and considered in my medical decision making (see chart for details).    76 year old female with a history of CVA with left-sided hemiparesis, hypertension, chronic atrial fibrillation on eliquis, presents with concern for hematuria. Patient with normal vital signs, afebrile with rectal temperature, and well-appearing. He is a benign abdominal exam. Lactic acid is within normal limits. CMP shows mild hypokalemia, and patient was given 40 mEq of potassium. Patient with mild anemia with a hemoglobin of 11.9, and most recent hemoglobin of 13.9 in March.  She is noted to have gross hematuria with amber color, no clots. Urinalysis shows no sign of urinary tract infection.  Given patient without significant bladder hemorrhage, very mild anemia of 11.9, well appearance, normal vital signs after hours of observation in the emergency department, feel her hematuria may be managed and evaluated as an outpatient. Recommended they hold her nighttime dose of all her eliquis, and discuss risks and benefits of the medication with her PCP tomorrow.  Hematuria may be secondary to eliquis use.  Family reports that she is on antibiotic called in by her primary care physician, and recommend continuing this as rx until completion despite urine now reassuring for infxn.  Recommended follow-up with urology and PCP, return for worsening symptoms. Patient initially reported  difficulty urinating, and was given hydration, however bladder scan shows 30 mL in her bladder, and doubt new onset of urinary retention.  Patient discharged in stable condition with understanding of reasons to return.   Final Clinical Impressions(s) / ED Diagnoses   Final diagnoses:  Gross hematuria    New Prescriptions New Prescriptions   No medications on file     Alvira Monday, MD 03/12/17 1750

## 2017-03-13 LAB — URINE CULTURE: CULTURE: NO GROWTH

## 2017-11-12 IMAGING — CT CT HEAD W/O CM
3 of 4 series · 18 of 47 positions shown, 21 images · non-contrast
Comparison: None.

CLINICAL DATA: Left facial droop and left sided hemiparesis.

EXAM:
CT HEAD WITHOUT CONTRAST
TECHNIQUE: Contiguous axial images were obtained from the base of the skull
through the vertex without intravenous contrast.

[Series 201: head w/o, idose (1) · axial · non-contrast · 0.41mm/px · z∈[+89,+214]mm · 12 of 31 slices shown, 15 images]
[im 3/31  brain]
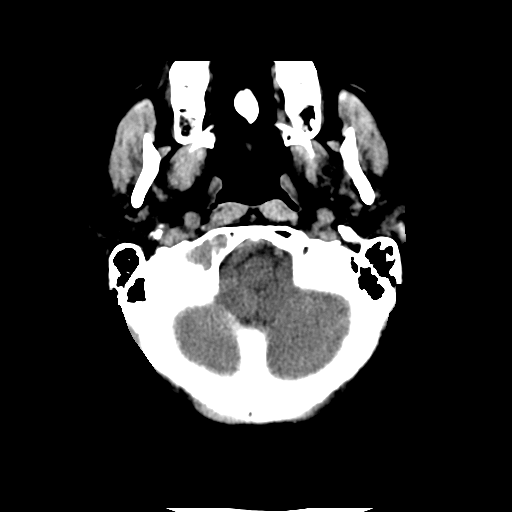
[im 3/31  bone]
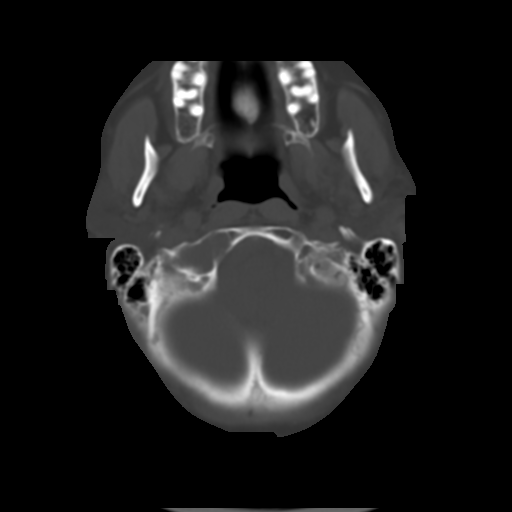
[im 5/31  brain]
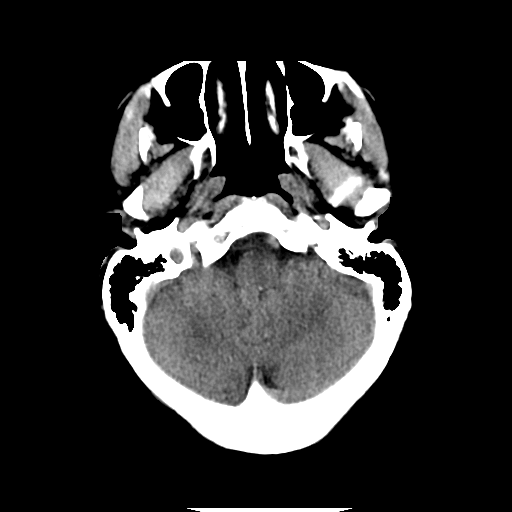
[im 7/31  brain]
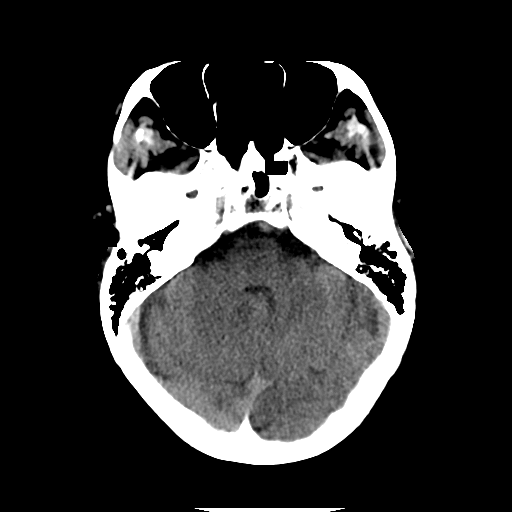
[im 9/31  brain]
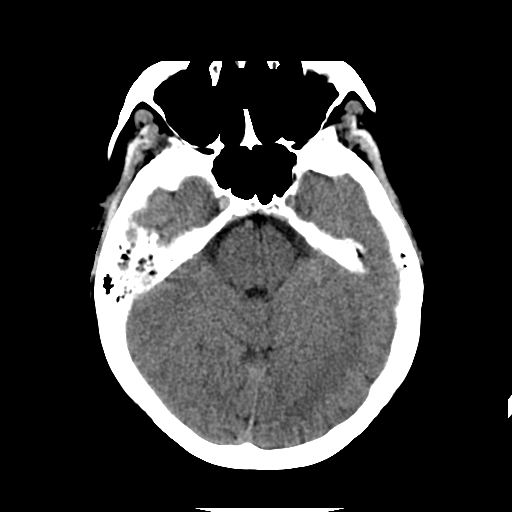
[im 11/31  brain]
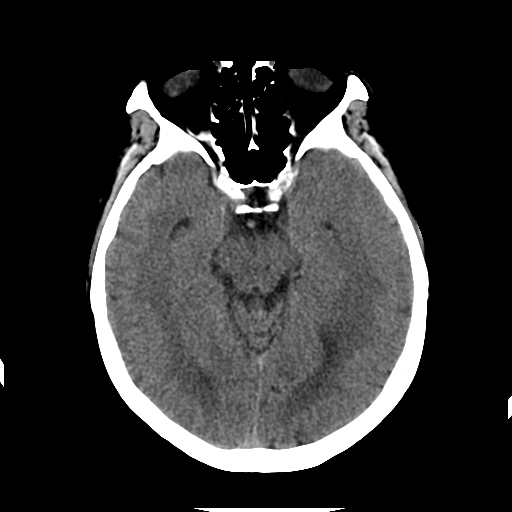
[im 11/31  bone]
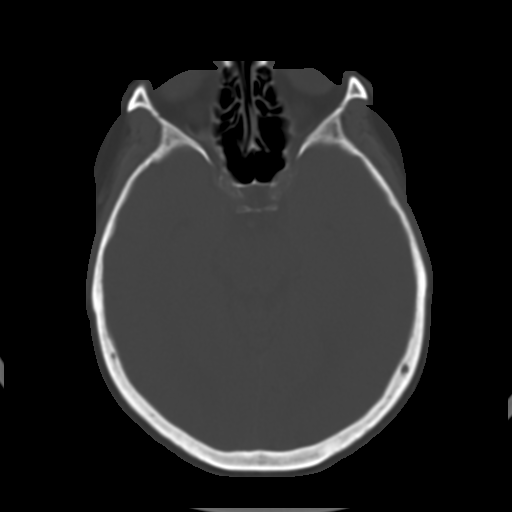
[im 13/31  brain]
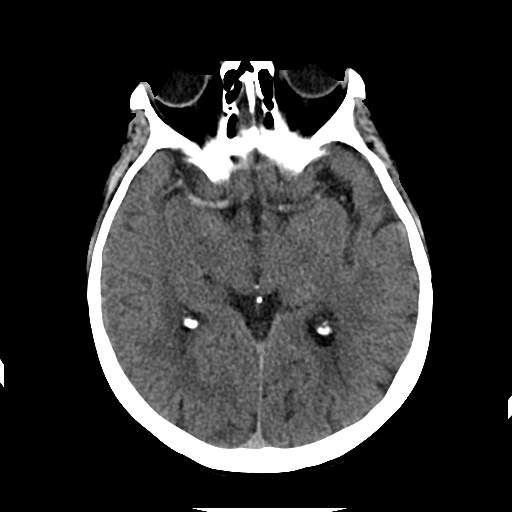
[im 18/31  brain]
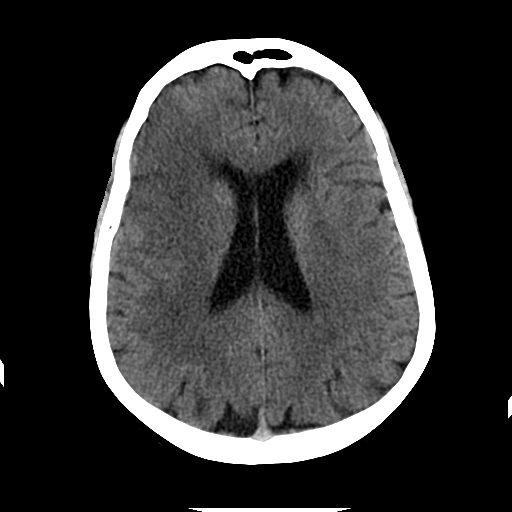
[im 20/31  brain]
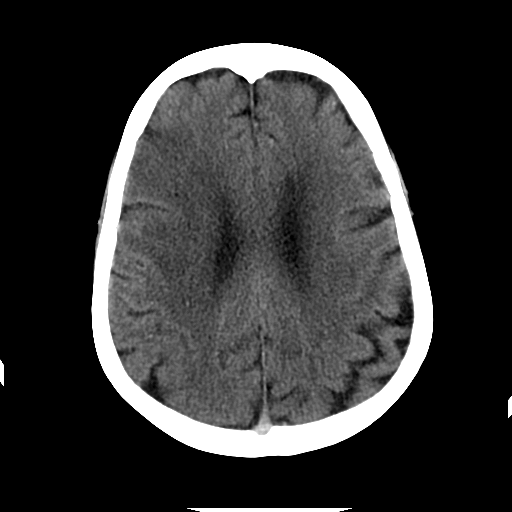
[im 22/31  brain]
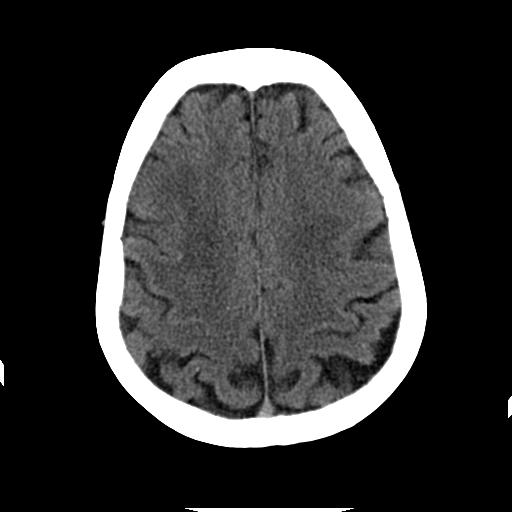
[im 22/31  bone]
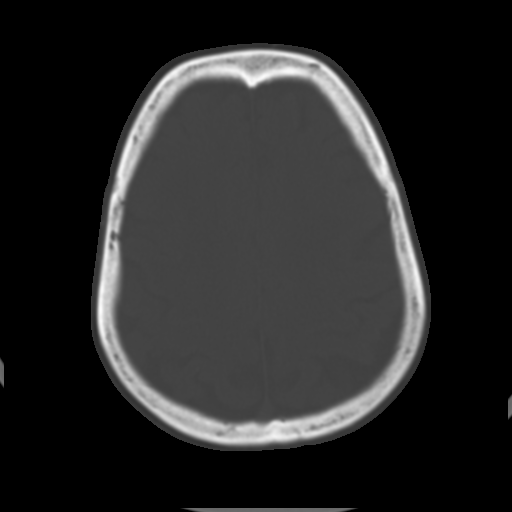
[im 24/31  brain]
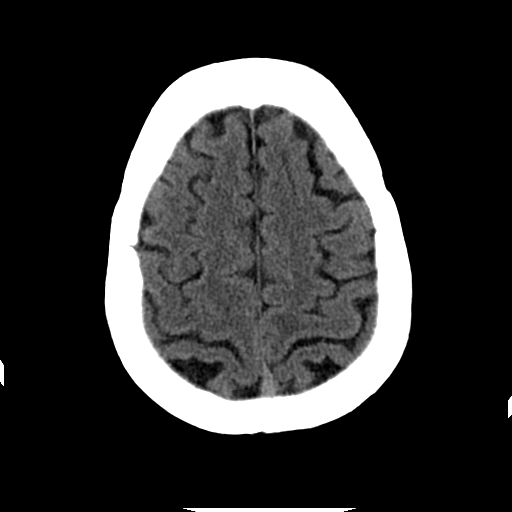
[im 26/31  brain]
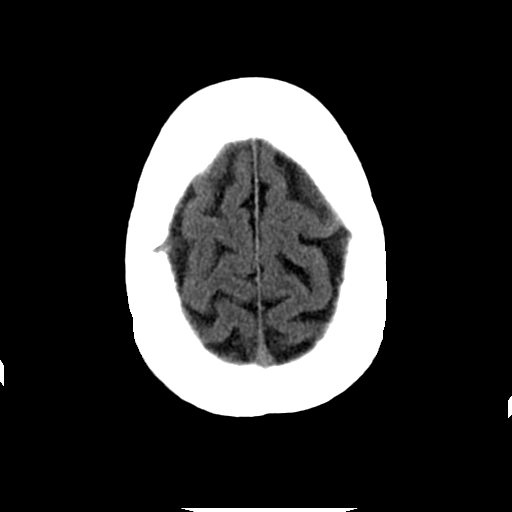
[im 28/31  brain]
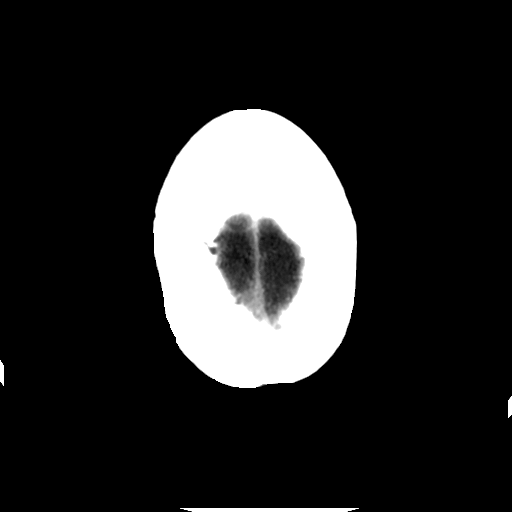

[Series 203: coronal st, idose (1) · coronal · 0.40mm/px · 3 of 69 slices shown]
[im 23/69  brain]
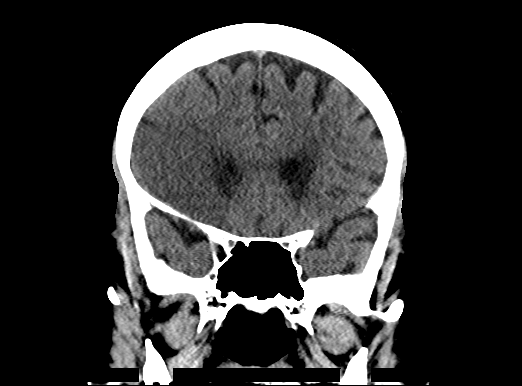
[im 31/69  brain]
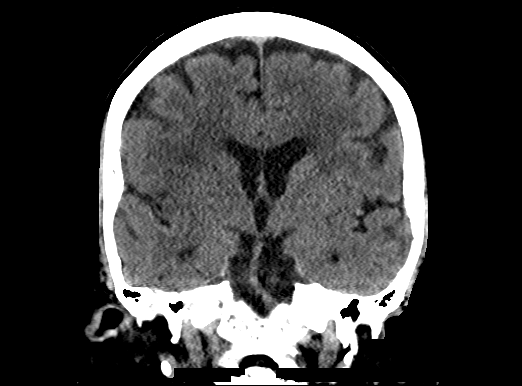
[im 38/69  brain]
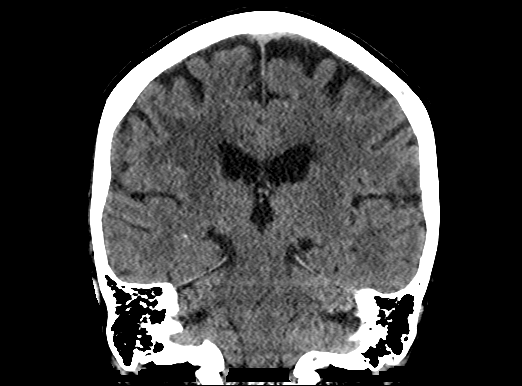

[Series 204: sagittal st, idose (1) · sagittal · 0.40mm/px · 3 of 69 slices shown]
[im 23/69  brain]
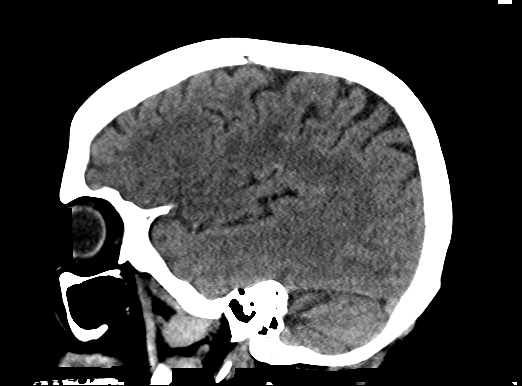
[im 35/69  brain]
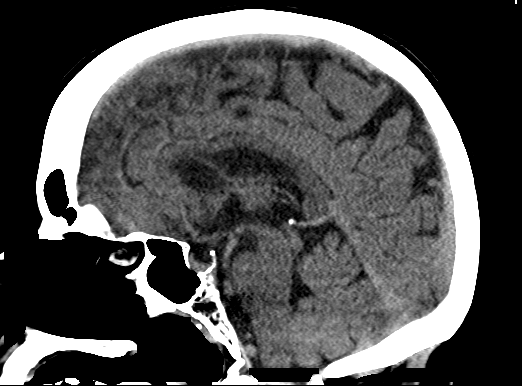
[im 46/69  brain]
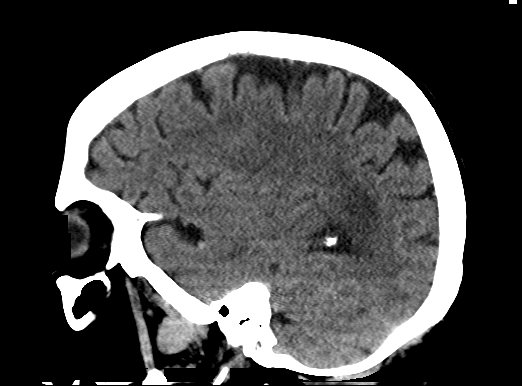

[18 of 47 positions shown; findings below may reference images not displayed]

FINDINGS: Brain: Evidence of cytotoxic edema in the right MCA territory
consistent with a large infarction. There is a dense right MCA sign.
In no hemorrhage. No extra-axial fluid collections. Ventricles are
in the midline without significant mass effect or shift. The right
lateral ventricle may be slightly flattened. Underlying atrophy and
periventricular white matter disease.

Vascular: Dense right MCA sign.  Vascular calcifications noted.

Skull: No skull fracture or bone lesions.

Sinuses/Orbits: The paranasal sinuses and mastoid air cells are
clear. The globes are intact.

Other: No scalp lesion or hematoma.
IMPRESSION: Acute or subacute right MCA territory infarction with dense MCA
sign. No intracranial hemorrhage.

Underlying cerebral atrophy and periventricular white matter
disease.

These results were called by telephone at the time of interpretation
on 11/22/2016 at [DATE] to Dr. LUCIANO NEZHA , who verbally
acknowledged these results.
